# Patient Record
Sex: Male | Born: 1950 | Race: White | Hispanic: No | Marital: Married | Smoking: Former smoker
Health system: Southern US, Community
[De-identification: ages and names within clinical notes are randomized; demographics above are authoritative.]

## PROBLEM LIST (undated history)

## (undated) DIAGNOSIS — I10 Essential (primary) hypertension: Secondary | ICD-10-CM

## (undated) DIAGNOSIS — E7800 Pure hypercholesterolemia, unspecified: Secondary | ICD-10-CM

## (undated) DIAGNOSIS — G4730 Sleep apnea, unspecified: Secondary | ICD-10-CM

## (undated) DIAGNOSIS — G4733 Obstructive sleep apnea (adult) (pediatric): Secondary | ICD-10-CM

## (undated) DIAGNOSIS — I029 Rheumatic chorea without heart involvement: Secondary | ICD-10-CM

## (undated) DIAGNOSIS — T7840XA Allergy, unspecified, initial encounter: Secondary | ICD-10-CM

## (undated) DIAGNOSIS — I208 Other forms of angina pectoris: Secondary | ICD-10-CM

## (undated) DIAGNOSIS — M1990 Unspecified osteoarthritis, unspecified site: Secondary | ICD-10-CM

## (undated) DIAGNOSIS — I2510 Atherosclerotic heart disease of native coronary artery without angina pectoris: Secondary | ICD-10-CM

## (undated) DIAGNOSIS — H532 Diplopia: Secondary | ICD-10-CM

## (undated) DIAGNOSIS — R7303 Prediabetes: Secondary | ICD-10-CM

## (undated) DIAGNOSIS — I2089 Other forms of angina pectoris: Secondary | ICD-10-CM

## (undated) DIAGNOSIS — Z8679 Personal history of other diseases of the circulatory system: Secondary | ICD-10-CM

## (undated) DIAGNOSIS — K219 Gastro-esophageal reflux disease without esophagitis: Secondary | ICD-10-CM

## (undated) DIAGNOSIS — R011 Cardiac murmur, unspecified: Secondary | ICD-10-CM

## (undated) HISTORY — DX: Obstructive sleep apnea (adult) (pediatric): G47.33

## (undated) HISTORY — DX: Atherosclerotic heart disease of native coronary artery without angina pectoris: I25.10

## (undated) HISTORY — DX: Rheumatic chorea without heart involvement: I02.9

## (undated) HISTORY — DX: Personal history of other diseases of the circulatory system: Z86.79

## (undated) HISTORY — PX: OTHER SURGICAL HISTORY: SHX169

## (undated) HISTORY — DX: Other forms of angina pectoris: I20.89

## (undated) HISTORY — DX: Pure hypercholesterolemia, unspecified: E78.00

## (undated) HISTORY — PX: NO PAST SURGERIES: SHX2092

## (undated) HISTORY — DX: Prediabetes: R73.03

## (undated) HISTORY — DX: Diplopia: H53.2

## (undated) HISTORY — DX: Allergy, unspecified, initial encounter: T78.40XA

## (undated) HISTORY — DX: Sleep apnea, unspecified: G47.30

## (undated) HISTORY — DX: Other forms of angina pectoris: I20.8

---

## 2004-09-01 HISTORY — PX: COLONOSCOPY: SHX174

## 2016-01-02 ENCOUNTER — Encounter: Payer: Self-pay | Admitting: Neurology

## 2016-01-02 ENCOUNTER — Ambulatory Visit (INDEPENDENT_AMBULATORY_CARE_PROVIDER_SITE_OTHER): Payer: BLUE CROSS/BLUE SHIELD | Admitting: Neurology

## 2016-01-02 VITALS — BP 148/74 | HR 72 | Ht 72.0 in | Wt 245.8 lb

## 2016-01-02 DIAGNOSIS — H532 Diplopia: Secondary | ICD-10-CM

## 2016-01-02 HISTORY — DX: Diplopia: H53.2

## 2016-01-02 NOTE — Progress Notes (Signed)
Reason for visit: Double vision  Referring physician: Dr. Gregary Barajas is a 65 y.o. male  History of present illness:  Harold Barajas is a 65 year old right-handed white male with a history of onset of double vision that occurred upon awakening on 12/14/2015. The patient had a painless onset of the double vision, but he also noted significant facial swelling at the time that the double vision started. The patient has had some issues with some sinus symptoms for several weeks prior to the onset of the double vision. The patient denied any headache, numbness or weakness on the face, arms, or legs. He denied any trouble with dizziness or vertigo. He has tinnitus, no ear pain. He denies any speech or swallowing changes. He has not had any confusion or neck stiffness. He reports that the double vision is vertical with some horizontal component at times. The double vision is worse when he is looking down to the left. When he tilts his head to the left, the double vision improves, but if he tilts his head to the right the double vision worsens. The patient claims that he has a history of borderline diabetes. He has not had a head scan or any blood work done since the onset of the double vision. He is sent to this office for an evaluation. The patient reports no ptosis on either side since the onset of the double vision.  Past Medical History  Diagnosis Date  . High cholesterol   . Angina at rest [facility])   . Diplopia 01/02/2016  . OSA treated with BiPAP   . 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 History of rheumatic fever   . Borderline diabetes     Past Surgical History  Procedure Laterality Date  . None      Family History  Problem Relation Age of Onset  . Stroke Mother   . Heart attack Mother   . Aneurysm Mother     cerebral  . Aneurysm Father   . Diabetes Brother   . Heart attack Brother   . Leukemia Maternal Grandmother     Social history:  reports that he has quit smoking. He  does not have any smokeless tobacco history on file. He reports that he drinks alcohol. He reports that he does not use illicit drugs.  Medications:  Prior to Admission medications   Medication Sig Start Date End Date Taking? Authorizing Provider  aspirin 81 MG tablet Take 81 mg by mouth daily.   Yes Historical Provider, MD  cetirizine (ZYRTEC) 10 MG tablet Take 10 mg by mouth daily. 12/13/15  Yes Historical Provider, MD  meclizine (ANTIVERT) 12.5 MG tablet 1 TABLET AS NEEDED ONCE A DAY AT BEDTIME ORALLY 30 DAY(S) 12/21/15  Yes Historical Provider, MD  metoprolol (LOPRESSOR) 50 MG tablet  12/13/15  Yes Historical Provider, MD  Omega-3 Fatty Acids (FISH OIL PO) Take 1,400 mg by mouth daily.   Yes Historical Provider, MD  simvastatin (ZOCOR) 40 MG tablet Reported on 01/02/2016 10/28/15   Historical Provider, MD      Allergies  Allergen Reactions  . Penicillins     ROS:  Out of a complete 14 system review of symptoms, the patient complains only of the following symptoms, and all other reviewed systems are negative.  Ringing in the ears Blurred vision, double vision Joint pain Runny nose Dizziness Anxiety Memory loss Sleep apnea on CPAP  Blood pressure 148/74, pulse 72, height 6' (1.829 m),  weight 245 lb 12 oz (111.471 kg).  Physical Exam  General: The patient is alert and cooperative at the time of the examination. The patient is moderately obese.  Eyes: Pupils are equal, round, and reactive to light. Discs are flat bilaterally. Good venous pulsations are seen.  Neck: The neck is supple, no carotid bruits are noted.  Respiratory: The respiratory examination is clear.  Cardiovascular: The cardiovascular examination reveals a regular rate and rhythm, no obvious murmurs or rubs are noted.  Skin: Extremities are without significant edema.  Neurologic Exam  Mental status: The patient is alert and oriented x 3 at the time of the examination. The patient has apparent normal recent  and remote memory, with an apparently normal attention span and concentration ability.  Cranial nerves: Facial symmetry is present. There is good sensation of the face to pinprick and soft touch bilaterally. The strength of the facial muscles and the muscles to head turning and shoulder shrug are normal bilaterally. Speech is well enunciated, no aphasia or dysarthria is noted. Extraocular movements are full. Visual fields are full. Cover test is positive when the patient is looking down into the left. The left eye goes down and the right eye goes up. The tongue is midline, and the patient has symmetric elevation of the soft palate. No obvious hearing deficits are noted.  Motor: The motor testing reveals 5 over 5 strength of all 4 extremities. Good symmetric motor tone is noted throughout.  Sensory: Sensory testing is intact to pinprick, soft touch, vibration sensation, and position sense on all 4 extremities. No evidence of extinction is noted.  Coordination: Cerebellar testing reveals good finger-nose-finger and heel-to-shin bilaterally.  Gait and station: Gait is normal. Tandem gait is normal. Romberg is negative. No drift is seen.  Reflexes: Deep tendon reflexes are symmetric and normal bilaterally. Toes are downgoing bilaterally.   Assessment/Plan:  1. Double vision  2. History of borderline diabetes  The patient appears clinically to have a right fourth cranial nerve palsy. The patient will be sent for an evaluation to include MRI of the brain, and MRA of the head given the family history of cerebral aneurysm. The patient will be sent for blood work today. He will follow-up in 4 months. Hopefully, the double vision will spontaneously improve within the next several months.  Jill Alexanders MD 01/02/2016 8:03 PM  [facility] [address] [place], [place] [address]  Phone [phone] Fax [phone]

## 2016-01-02 NOTE — Patient Instructions (Signed)
Diplopia °Diplopia is the condition of having double vision or seeing two of a single object. There are many causes of diplopia. Some are not dangerous and can be easily corrected. Diplopia may also be a symptom of a serious medical problem. °There are two types of diplopia. °· Monocular diplopia. This is double vision that affects only one eye. Monocular diplopia is often caused by a clouding of the lens in your eye (cataract) or by disruptions in the way that your eye focuses light. °· Binocular diplopia. This is double vision that affects both eyes. However, when you shut one eye, the double vision will go away. Binocular diplopia may be more serious. It can be caused by: °¨ Problems with the nerves or muscles that are responsible for eye movement. °¨ Neurologic diseases. °¨ Thyroid problems. °¨ Tumors. °¨ An infection near your eyes. °¨ A stroke. °You may need to see a health care provider who specializes in eye conditions (ophthalmologist) or a nerve specialist (neurologist) to find the cause. °HOME CARE INSTRUCTIONS °· Tell your health care provider about any changes in your vision. °· Do not drive or operate heavy machinery if diplopia interferes with your vision. °· Keep all follow-up visits as directed by your health care provider. This is important. °SEEK MEDICAL CARE IF: °· Your diplopia gets worse. °· You develop any other symptoms along with your diplopia, such as: °¨ Weakness. °¨ Numbness. °¨ Headache. °¨ Eye pain. °¨ Clumsiness. °¨ Nausea. °¨ Drooping eyelids. °¨ Abnormal movement of one of your eyes. °SEEK IMMEDIATE MEDICAL CARE IF: °· You have sudden vision loss. °· You suddenly get a very bad headache. °· You have sudden weakness or numbness. °· You suddenly lose the ability to speak, understand speech, or both. °  °This information is not intended to replace advice given to you by your health care provider. Make sure you discuss any questions you have with your health care provider. °  °Document  Released: 06/19/2004 Document Revised: 01/02/2015 Document Reviewed: 07/12/2014 °Elsevier Interactive Patient Education ©2016 [employer] ° °

## 2016-01-04 ENCOUNTER — Telehealth: Payer: Self-pay | Admitting: Neurology

## 2016-01-04 LAB — COMPREHENSIVE METABOLIC PANEL
ALBUMIN: 4.2 g/dL (ref 3.6–4.8)
ALK PHOS: 126 IU/L — AB (ref 39–117)
ALT: 23 IU/L (ref 0–44)
AST: 16 IU/L (ref 0–40)
Albumin/Globulin Ratio: 1.6 (ref 1.2–2.2)
BUN/Creatinine Ratio: 13 (ref 10–24)
BUN: 15 mg/dL (ref 8–27)
Bilirubin Total: 0.3 mg/dL (ref 0.0–1.2)
CALCIUM: 9.2 mg/dL (ref 8.6–10.2)
CO2: 27 mmol/L (ref 18–29)
CREATININE: 1.13 mg/dL (ref 0.76–1.27)
Chloride: 100 mmol/L (ref 96–106)
GFR calc Af Amer: 79 mL/min/{1.73_m2} (ref >59)
GFR calc non Af Amer: 68 mL/min/{1.73_m2} (ref >59)
GLUCOSE: 153 mg/dL — AB (ref 65–99)
Globulin, Total: 2.6 g/dL (ref 1.5–4.5)
Potassium: 5.3 mmol/L — ABNORMAL HIGH (ref 3.5–5.2)
Sodium: 141 mmol/L (ref 134–144)
Total Protein: 6.8 g/dL (ref 6.0–8.5)

## 2016-01-04 LAB — VITAMIN B12: VITAMIN B 12: 244 pg/mL (ref 211–946)

## 2016-01-04 LAB — HEMOGLOBIN A1C
Est. average glucose Bld gHb Est-mCnc: 134 mg/dL
HEMOGLOBIN A1C: 6.3 % — AB (ref 4.8–5.6)

## 2016-01-04 LAB — SEDIMENTATION RATE: Sed Rate: 5 mm/hr (ref 0–30)

## 2016-01-04 LAB — ACETYLCHOLINE RECEPTOR, BINDING

## 2016-01-04 LAB — B. BURGDORFI ANTIBODIES

## 2016-01-04 LAB — ANGIOTENSIN CONVERTING ENZYME: Angio Convert Enzyme: 26 U/L (ref 14–82)

## 2016-01-04 LAB — ANA W/REFLEX: Anti Nuclear Antibody(ANA): NEGATIVE

## 2016-01-04 NOTE — Telephone Encounter (Signed)
I called the patient. The blood work showed a slightly elevated hemoglobin A1c, the patient has prediabetes. Potassium was minimally elevated as was the alk phosphatase.

## 2016-01-09 NOTE — Telephone Encounter (Signed)
Returned pt TC. Reviewed A1C results (6.3) w/ pt. He is aware that A1C between 5.7 and 6.4 is indicative of pre-diabetes. Verbalized understanding and appreciation for call.

## 2016-01-09 NOTE — Telephone Encounter (Signed)
Patient called to request A1c results.

## 2016-01-16 ENCOUNTER — Ambulatory Visit (INDEPENDENT_AMBULATORY_CARE_PROVIDER_SITE_OTHER): Payer: BLUE CROSS/BLUE SHIELD

## 2016-01-16 DIAGNOSIS — H532 Diplopia: Secondary | ICD-10-CM | POA: Diagnosis not present

## 2016-01-17 MED ORDER — GADOPENTETATE DIMEGLUMINE 469.01 MG/ML IV SOLN: 20.0000 mL | Freq: Once | INTRAVENOUS | Status: AC | PRN

## 2016-01-18 ENCOUNTER — Telehealth: Payer: Self-pay | Admitting: Neurology

## 2016-01-18 NOTE — Telephone Encounter (Signed)
  I called patient. MRI the brain is essentially normal, minimal atrophy seen. The MRA does not show any aneurysm, no significant stenosis. The patient indicates that the double vision is already improving, this suggests a good prognosis for full recovery.   MRI brain 01/17/16:  IMPRESSION: This MRI of the brain with and without contrast shows the following: 1. Brain parenchyma appears normal before and after contrast. 2. Minimal age-appropriate cortical atrophy. 3. No acute findings.   MRA head 01/17/16:  IMPRESSION: This MR angiogram of the intracranial vessels shows the following: 1. 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 This does not appear to be hemodynamically significant. 2. No aneurysms are noted.

## 2016-05-09 ENCOUNTER — Encounter: Payer: Self-pay | Admitting: Neurology

## 2016-05-09 ENCOUNTER — Ambulatory Visit (INDEPENDENT_AMBULATORY_CARE_PROVIDER_SITE_OTHER): Payer: BLUE CROSS/BLUE SHIELD | Admitting: Neurology

## 2016-05-09 VITALS — BP 142/76 | HR 62 | Ht 72.0 in | Wt 248.5 lb

## 2016-05-09 DIAGNOSIS — H4911 Fourth [trochlear] nerve palsy, right eye: Secondary | ICD-10-CM | POA: Diagnosis not present

## 2016-05-09 DIAGNOSIS — H4910 Fourth [trochlear] nerve palsy, unspecified eye: Secondary | ICD-10-CM | POA: Insufficient documentation

## 2016-05-09 DIAGNOSIS — H532 Diplopia: Secondary | ICD-10-CM | POA: Diagnosis not present

## 2016-05-09 NOTE — Progress Notes (Signed)
Reason for visit: Double vision  Harold Barajas is an 65 y.o. male  History of present illness:  Harold Barajas is a 65 year old right-handed white male with a history of prediabetes. The patient had onset of a right fourth cranial nerve palsy in April 2017. The patient has done quite well, the double vision has completely resolved over a couple months after onset. MRI the brain and MRA of the head were unremarkable, no evidence of an aneurysm was seen. The patient returns for an evaluation. He has not had any further problems with double vision, numbness or weakness of extremities, or difficulty with balance.  Past Medical History:  Diagnosis Date  . Angina at rest [facility])   . Borderline diabetes   . Diplopia 01/02/2016  . High cholesterol   . History of rheumatic fever   . OSA treated with BiPAP   . 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 None      Family History  Problem Relation Age of Onset  . Stroke Mother   . Heart attack Mother   . Aneurysm Mother     cerebral  . Aneurysm Father   . Diabetes Brother   . Heart attack Brother   . Leukemia Maternal Grandmother     Social history:  reports that he has quit smoking. He has never used smokeless tobacco. He reports that he drinks alcohol. He reports that he does not use drugs.    Allergies  Allergen Reactions  . Penicillins     Medications:  Prior to Admission medications   Medication Sig Start Date End Date Taking? Authorizing Provider  aspirin 81 MG tablet Take 81 mg by mouth daily.   Yes Historical Provider, MD  cetirizine (ZYRTEC) 10 MG tablet Take 10 mg by mouth daily. 12/13/15  Yes Historical Provider, MD  diclofenac (VOLTAREN) 75 MG EC tablet  04/29/16  Yes Historical Provider, MD  meclizine (ANTIVERT) 12.5 MG tablet 1 TABLET AS NEEDED ONCE A DAY AT BEDTIME ORALLY 30 DAY(S) 12/21/15  Yes Historical Provider, MD  metoprolol (LOPRESSOR) 50 MG tablet  12/13/15  Yes  Historical Provider, MD  Omega-3 Fatty Acids (FISH OIL PO) Take 1,400 mg by mouth daily.   Yes Historical Provider, MD  simvastatin (ZOCOR) 40 MG tablet Reported on 01/02/2016 10/28/15  Yes Historical Provider, MD    ROS:  Out of a complete 14 system review of symptoms, the patient complains only of the following symptoms, and all other reviewed systems are negative.  Sleep apnea Joint pain, joint swelling, back pain  Blood pressure (!) 142/76, pulse 62, height 6' (1.829 m), weight 248 lb 8 oz (112.7 kg).  Physical Exam  General: The patient is alert and cooperative at the time of the examination. The patient is moderately obese.  Skin: No significant peripheral edema is noted.   Neurologic Exam  Mental status: The patient is alert and oriented x 3 at the time of the examination. The patient has apparent normal recent and remote memory, with an apparently normal attention span and concentration ability.   Cranial nerves: Facial symmetry is present. Speech is normal, no aphasia or dysarthria is noted. Extraocular movements are full. Visual fields are full.  Motor: The patient has good strength in all 4 extremities.  Sensory examination: Soft touch sensation is symmetric on the face, arms, and legs.  Coordination: The patient has good finger-nose-finger and heel-to-shin bilaterally.  Gait and  station: The patient has a normal gait. Tandem gait is normal. Romberg is negative. No drift is seen.  Reflexes: Deep tendon reflexes are symmetric.   MRI brain 01/18/16:  IMPRESSION:  This MRI of the brain with and without contrast shows the following: 1.   Brain parenchyma appears normal before and after contrast. 2.   Minimal age-appropriate cortical atrophy. 3.   No acute findings.   MRA head 01/18/16:   IMPRESSION:  This MR angiogram of the intracranial vessels shows the following: 1.    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 This does not appear  to be hemodynamically significant. 2.    No aneurysms are noted.    Assessment/Plan:  1. Right fourth nerve palsy, resolved  The patient has had full resolution of his double vision. This likely was related to the history of prediabetes. I have encouraged him to enter an exercise program, and a low carbohydrate diet to help improve the insulin resistance. The patient will follow-up through this office on an as-needed basis.  Jill Alexanders MD 05/09/2016 12:37 PM  [facility] [address] [place],  [address]  Phone [phone] Fax [phone]

## 2016-12-30 ENCOUNTER — Other Ambulatory Visit: Payer: Self-pay | Admitting: Neurological Surgery

## 2017-01-06 NOTE — Pre-Procedure Instructions (Addendum)
Gwynneth Macleod.  01/06/2017      [id number] [place], [place] - [address] [place] [address] Phone: [phone] Fax: [phone]    Your procedure is scheduled on 01/14/17  Report to [facility] Admitting at 1130 A.M.  Call this number if you have problems the morning of surgery:  [phone]   Remember:  Do not eat food or drink liquids after midnight.  Take these medicines the morning of surgery with A SIP OF WATER      Metoprolol, omeprazole(protonix)  STOP all herbel meds, nsaids (aleve,naproxen,advil,ibuprofen) prior to surgery starting TODAY 01/07/17 including all vitamins/supplements, aspirin, meloxicam   Do not wear jewelry, make-up or nail polish.  Do not wear lotions, powders, or perfumes, or deoderant.  Do not shave 48 hours prior to surgery.  Men may shave face and neck.  Do not bring valuables to the hospital.  [facility] is not responsible for any belongings or valuables.  Contacts, dentures or bridgework may not be worn into surgery.  Leave your suitcase in the car.  After surgery it may be brought to your room.  For patients admitted to the hospital, discharge time will be determined by your treatment team.  Patients discharged the day of surgery will not be allowed to drive home.   Special instructions:   Special Instructions: [place] - Preparing for Surgery  Before surgery, you can play an important role.  Because skin is not sterile, your skin needs to be as free of germs as possible.  You can reduce the number of germs on you skin by washing with CHG (chlorahexidine gluconate) soap before surgery.  CHG is an antiseptic cleaner which kills germs and bonds with the skin to continue killing germs even after washing.  Please DO NOT use if you have an allergy to CHG or antibacterial soaps.  If your skin becomes reddened/irritated stop using the CHG and inform your nurse when you arrive  at Short Stay.  Do not shave (including legs and underarms) for at least 48 hours prior to the first CHG shower.  You may shave your face.  Please follow these instructions carefully:   1.  Shower with CHG Soap the night before surgery and the morning of Surgery.  2.  If you choose to wash your hair, wash your hair first as usual with your normal shampoo.  3.  After you shampoo, rinse your hair and body thoroughly to remove the Shampoo.  4.  Use CHG as you would any other liquid soap.  You can apply chg directly  to the skin and wash gently with scrungie or a clean washcloth.  5.  Apply the CHG Soap to your body ONLY FROM THE NECK DOWN.  Do not use on open wounds or open sores.  Avoid contact with your eyes ears, mouth and genitals (private parts).  Wash genitals (private parts)       with your normal soap.  6.  Wash thoroughly, paying special attention to the area where your surgery will be performed.  7.  Thoroughly rinse your body with warm water from the neck down.  8.  DO NOT shower/wash with your normal soap after using and rinsing off the CHG Soap.  9.  Pat yourself dry with a clean towel.            10.  Wear clean pajamas.  11.  Place clean sheets on your bed the night of your first shower and do not sleep with pets.  Day of Surgery  Do not apply any lotions/deodorants the morning of surgery.  Please wear clean clothes to the hospital/surgery center.  Please read over the  fact sheets that you were given.

## 2017-01-07 ENCOUNTER — Encounter (HOSPITAL_COMMUNITY): Payer: Self-pay

## 2017-01-07 ENCOUNTER — Encounter (HOSPITAL_COMMUNITY)
Admission: RE | Admit: 2017-01-07 | Discharge: 2017-01-07 | Disposition: A | Discharge status: Home / Self Care | Payer: Medicare Other | Source: Ambulatory Visit | Attending: Neurological Surgery | Admitting: Neurological Surgery

## 2017-01-07 DIAGNOSIS — Z87891 Personal history of nicotine dependence: Secondary | ICD-10-CM | POA: Insufficient documentation

## 2017-01-07 DIAGNOSIS — G4733 Obstructive sleep apnea (adult) (pediatric): Secondary | ICD-10-CM | POA: Insufficient documentation

## 2017-01-07 DIAGNOSIS — E785 Hyperlipidemia, unspecified: Secondary | ICD-10-CM | POA: Insufficient documentation

## 2017-01-07 DIAGNOSIS — K219 Gastro-esophageal reflux disease without esophagitis: Secondary | ICD-10-CM | POA: Insufficient documentation

## 2017-01-07 DIAGNOSIS — Z01812 Encounter for preprocedural laboratory examination: Secondary | ICD-10-CM | POA: Diagnosis present

## 2017-01-07 HISTORY — DX: Cardiac murmur, unspecified: R01.1

## 2017-01-07 HISTORY — DX: Prediabetes: R73.03

## 2017-01-07 HISTORY — DX: Unspecified osteoarthritis, unspecified site: M19.90

## 2017-01-07 HISTORY — DX: Gastro-esophageal reflux disease without esophagitis: K21.9

## 2017-01-07 HISTORY — DX: Essential (primary) hypertension: I10

## 2017-01-07 LAB — BASIC METABOLIC PANEL
ANION GAP: 8 (ref 5–15)
BUN: 15 mg/dL (ref 6–20)
CALCIUM: 8.7 mg/dL — AB (ref 8.9–10.3)
CO2: 25 mmol/L (ref 22–32)
Chloride: 103 mmol/L (ref 101–111)
Creatinine, Ser: 1.25 mg/dL — ABNORMAL HIGH (ref 0.61–1.24)
GFR calc Af Amer: >60 mL/min (ref >60)
GFR, EST NON AFRICAN AMERICAN: 59 mL/min — AB (ref >60)
GLUCOSE: 185 mg/dL — AB (ref 65–99)
Potassium: 4.3 mmol/L (ref 3.5–5.1)
Sodium: 136 mmol/L (ref 135–145)

## 2017-01-07 LAB — CBC
HCT: 44.2 % (ref 39.0–52.0)
HEMOGLOBIN: 15.6 g/dL (ref 13.0–17.0)
MCH: 31.8 pg (ref 26.0–34.0)
MCHC: 35.3 g/dL (ref 30.0–36.0)
MCV: 90 fL (ref 78.0–100.0)
Platelets: 195 10*3/uL (ref 150–400)
RBC: 4.91 MIL/uL (ref 4.22–5.81)
RDW: 12.9 % (ref 11.5–15.5)
WBC: 6.8 10*3/uL (ref 4.0–10.5)

## 2017-01-07 LAB — SURGICAL PCR SCREEN
MRSA, PCR: NEGATIVE
STAPHYLOCOCCUS AUREUS: NEGATIVE

## 2017-01-07 LAB — GLUCOSE, CAPILLARY: Glucose-Capillary: 207 mg/dL — ABNORMAL HIGH (ref 65–99)

## 2017-01-08 LAB — HEMOGLOBIN A1C
HEMOGLOBIN A1C: 6.9 % — AB (ref 4.8–5.6)
MEAN PLASMA GLUCOSE: 151 mg/dL

## 2017-01-09 NOTE — Progress Notes (Signed)
Anesthesia Chart Review:  Pt is a 66 year old male scheduled for L2-S1 laminectomy for decompression on 01/14/2017 with Cyndy Freeze, M.D.  - PCP is Eulogio Ditch, NP - Cardiologist is Delanna Notice, MD who cleared pt for surgery.   PMH includes: HTN, pre-diabetes, hyperlipidemia, OSA, heart murmur (as a child), history of rheumatic fever (as a child), GERD. Former smoker. BMI 35.  Medications include: ASA 81 mg, lisinopril, metoprolol, Prilosec, simvastatin  Preoperative labs reviewed. HbA1c 6.9, glucose 185.  EKG 10/02/16: Sinus rhythm.  By notes, patient had exercise echo 11/10/06 that showed no ischemia, EF 60%.  By notes, pt had cardiac cath 11/27/95 that showed:  - LM: normal - LAD: Mid LAD tapers to 50% with diffuse disease. - Ramus intermedius: Not present - LCx: Normal - RCA: Normal and small, nondominant  If no changes, I anticipate pt can proceed with surgery as scheduled.   Willeen Cass, FNP-BC [facility] Short Stay Surgical [facility]/Anesthesiology Phone: [phone] 01/09/2017 2:48 PM

## 2017-01-19 MED ORDER — VANCOMYCIN HCL 10 G IV SOLR
1500.0000 mg | INTRAVENOUS | Status: AC
  Administered 2017-01-20: 1500 mg via INTRAVENOUS
  Filled 2017-01-19: qty 1500

## 2017-01-19 NOTE — Progress Notes (Signed)
Pt instructed to arrive at 12:30; Janett Billow, Surgical Coordinator, confirmed arrival time is 12:30 for 2:30 procedure.

## 2017-01-20 ENCOUNTER — Inpatient Hospital Stay (HOSPITAL_COMMUNITY): Payer: Medicare Other

## 2017-01-20 ENCOUNTER — Inpatient Hospital Stay (HOSPITAL_COMMUNITY)
Admission: RE | Admit: 2017-01-20 | Discharge: 2017-01-21 | DRG: 520 | Disposition: A | Discharge status: Home / Self Care | Payer: Medicare Other | Source: Ambulatory Visit | Attending: Neurological Surgery | Admitting: Neurological Surgery

## 2017-01-20 ENCOUNTER — Encounter (HOSPITAL_COMMUNITY): Payer: Self-pay | Admitting: Anesthesiology

## 2017-01-20 ENCOUNTER — Inpatient Hospital Stay (HOSPITAL_COMMUNITY): Payer: Medicare Other | Admitting: Anesthesiology

## 2017-01-20 ENCOUNTER — Inpatient Hospital Stay (HOSPITAL_COMMUNITY): Payer: Medicare Other | Admitting: Vascular Surgery

## 2017-01-20 ENCOUNTER — Encounter (HOSPITAL_COMMUNITY)
Admission: RE | Disposition: A | Discharge status: Home / Self Care | Payer: Self-pay | Source: Ambulatory Visit | Attending: Neurological Surgery

## 2017-01-20 DIAGNOSIS — K219 Gastro-esophageal reflux disease without esophagitis: Secondary | ICD-10-CM | POA: Diagnosis present

## 2017-01-20 DIAGNOSIS — Z8619 Personal history of other infectious and parasitic diseases: Secondary | ICD-10-CM

## 2017-01-20 DIAGNOSIS — M48061 Spinal stenosis, lumbar region without neurogenic claudication: Secondary | ICD-10-CM | POA: Diagnosis present

## 2017-01-20 DIAGNOSIS — Z791 Long term (current) use of non-steroidal anti-inflammatories (NSAID): Secondary | ICD-10-CM

## 2017-01-20 DIAGNOSIS — I10 Essential (primary) hypertension: Secondary | ICD-10-CM | POA: Diagnosis present

## 2017-01-20 DIAGNOSIS — Z9989 Dependence on other enabling machines and devices: Secondary | ICD-10-CM | POA: Diagnosis not present

## 2017-01-20 DIAGNOSIS — Z880 Allergy status to penicillin: Secondary | ICD-10-CM

## 2017-01-20 DIAGNOSIS — R7303 Prediabetes: Secondary | ICD-10-CM | POA: Diagnosis present

## 2017-01-20 DIAGNOSIS — M1990 Unspecified osteoarthritis, unspecified site: Secondary | ICD-10-CM | POA: Diagnosis present

## 2017-01-20 DIAGNOSIS — E7800 Pure hypercholesterolemia, unspecified: Secondary | ICD-10-CM | POA: Diagnosis present

## 2017-01-20 DIAGNOSIS — Z419 Encounter for procedure for purposes other than remedying health state, unspecified: Secondary | ICD-10-CM

## 2017-01-20 DIAGNOSIS — Z79899 Other long term (current) drug therapy: Secondary | ICD-10-CM

## 2017-01-20 DIAGNOSIS — G4733 Obstructive sleep apnea (adult) (pediatric): Secondary | ICD-10-CM | POA: Diagnosis present

## 2017-01-20 DIAGNOSIS — Z7982 Long term (current) use of aspirin: Secondary | ICD-10-CM

## 2017-01-20 DIAGNOSIS — M5416 Radiculopathy, lumbar region: Secondary | ICD-10-CM | POA: Diagnosis present

## 2017-01-20 DIAGNOSIS — M4727 Other spondylosis with radiculopathy, lumbosacral region: Principal | ICD-10-CM | POA: Diagnosis present

## 2017-01-20 DIAGNOSIS — Z87891 Personal history of nicotine dependence: Secondary | ICD-10-CM

## 2017-01-20 HISTORY — PX: LUMBAR LAMINECTOMY/DECOMPRESSION MICRODISCECTOMY: SHX5026

## 2017-01-20 LAB — GLUCOSE, CAPILLARY
GLUCOSE-CAPILLARY: 100 mg/dL — AB (ref 65–99)
GLUCOSE-CAPILLARY: 162 mg/dL — AB (ref 65–99)
Glucose-Capillary: 110 mg/dL — ABNORMAL HIGH (ref 65–99)
Glucose-Capillary: 101 mg/dL — ABNORMAL HIGH (ref 65–99)

## 2017-01-20 SURGERY — LUMBAR LAMINECTOMY/DECOMPRESSION MICRODISCECTOMY 4 LEVEL
Anesthesia: General | Site: Back

## 2017-01-20 MED ORDER — HYDROMORPHONE HCL 1 MG/ML IJ SOLN: 1.0000 mg | INTRAMUSCULAR | Status: DC | PRN

## 2017-01-20 MED ORDER — PROPOFOL 10 MG/ML IV BOLUS
INTRAVENOUS | Status: DC | PRN
  Administered 2017-01-20: 200 mg via INTRAVENOUS

## 2017-01-20 MED ORDER — PROPOFOL 10 MG/ML IV BOLUS
INTRAVENOUS | Status: AC
  Filled 2017-01-20: qty 20

## 2017-01-20 MED ORDER — PROMETHAZINE HCL 25 MG/ML IJ SOLN: 6.2500 mg | INTRAMUSCULAR | Status: DC | PRN

## 2017-01-20 MED ORDER — ROCURONIUM BROMIDE 10 MG/ML (PF) SYRINGE
PREFILLED_SYRINGE | INTRAVENOUS | Status: AC
  Filled 2017-01-20: qty 10

## 2017-01-20 MED ORDER — BUPIVACAINE HCL 0.5 % IJ SOLN
INTRAMUSCULAR | Status: DC | PRN
  Administered 2017-01-20: 15 mL

## 2017-01-20 MED ORDER — FENTANYL CITRATE (PF) 100 MCG/2ML IJ SOLN
INTRAMUSCULAR | Status: DC | PRN
  Administered 2017-01-20 (×2): 50 ug via INTRAVENOUS
  Administered 2017-01-20: 150 ug via INTRAVENOUS

## 2017-01-20 MED ORDER — ALBUMIN HUMAN 5 % IV SOLN
INTRAVENOUS | Status: DC | PRN
  Administered 2017-01-20: 19:00:00 via INTRAVENOUS

## 2017-01-20 MED ORDER — HYDROMORPHONE HCL 1 MG/ML IJ SOLN
INTRAMUSCULAR | Status: AC
  Filled 2017-01-20: qty 0.5

## 2017-01-20 MED ORDER — MIDAZOLAM HCL 2 MG/2ML IJ SOLN
INTRAMUSCULAR | Status: AC
  Filled 2017-01-20: qty 2

## 2017-01-20 MED ORDER — FLEET ENEMA 7-19 GM/118ML RE ENEM: 1.0000 | ENEMA | Freq: Once | RECTAL | Status: DC | PRN

## 2017-01-20 MED ORDER — BUPIVACAINE LIPOSOME 1.3 % IJ SUSP
INTRAMUSCULAR | Status: DC | PRN
  Administered 2017-01-20: 20 mL

## 2017-01-20 MED ORDER — GABAPENTIN 300 MG PO CAPS
300.0000 mg | ORAL_CAPSULE | Freq: Three times a day (TID) | ORAL | Status: DC
  Administered 2017-01-20 – 2017-01-21 (×2): 300 mg via ORAL
  Filled 2017-01-20 (×2): qty 1

## 2017-01-20 MED ORDER — GELATIN ABSORBABLE MT POWD
OROMUCOSAL | Status: DC | PRN
  Administered 2017-01-20 (×2): via TOPICAL

## 2017-01-20 MED ORDER — DEXAMETHASONE SODIUM PHOSPHATE 10 MG/ML IJ SOLN
INTRAMUSCULAR | Status: DC | PRN
  Administered 2017-01-20: 10 mg via INTRAVENOUS

## 2017-01-20 MED ORDER — LIDOCAINE 2% (20 MG/ML) 5 ML SYRINGE
INTRAMUSCULAR | Status: AC
  Filled 2017-01-20: qty 5

## 2017-01-20 MED ORDER — ONDANSETRON HCL 4 MG PO TABS: 4.0000 mg | ORAL_TABLET | Freq: Four times a day (QID) | ORAL | Status: DC | PRN

## 2017-01-20 MED ORDER — MENTHOL 3 MG MT LOZG: 1.0000 | LOZENGE | OROMUCOSAL | Status: DC | PRN

## 2017-01-20 MED ORDER — ONDANSETRON HCL 4 MG/2ML IJ SOLN
INTRAMUSCULAR | Status: AC
  Filled 2017-01-20: qty 2

## 2017-01-20 MED ORDER — ALUM & MAG HYDROXIDE-SIMETH 200-200-20 MG/5ML PO SUSP: 30.0000 mL | Freq: Four times a day (QID) | ORAL | Status: DC | PRN

## 2017-01-20 MED ORDER — KETOROLAC TROMETHAMINE 30 MG/ML IJ SOLN
INTRAMUSCULAR | Status: AC
  Filled 2017-01-20: qty 1

## 2017-01-20 MED ORDER — ACETAMINOPHEN 10 MG/ML IV SOLN
INTRAVENOUS | Status: AC
  Filled 2017-01-20: qty 100

## 2017-01-20 MED ORDER — BUPIVACAINE LIPOSOME 1.3 % IJ SUSP
20.0000 mL | INTRAMUSCULAR | Status: DC
  Filled 2017-01-20: qty 20

## 2017-01-20 MED ORDER — VANCOMYCIN HCL 1000 MG IV SOLR
INTRAVENOUS | Status: DC | PRN
  Administered 2017-01-20: 1000 mg via TOPICAL

## 2017-01-20 MED ORDER — LIDOCAINE HCL (CARDIAC) 20 MG/ML IV SOLN
INTRAVENOUS | Status: DC | PRN
  Administered 2017-01-20: 60 mg via INTRAVENOUS

## 2017-01-20 MED ORDER — CHLORHEXIDINE GLUCONATE CLOTH 2 % EX PADS: 6.0000 | MEDICATED_PAD | Freq: Once | CUTANEOUS | Status: DC

## 2017-01-20 MED ORDER — FENTANYL CITRATE (PF) 250 MCG/5ML IJ SOLN
INTRAMUSCULAR | Status: AC
  Filled 2017-01-20: qty 5

## 2017-01-20 MED ORDER — VANCOMYCIN HCL IN DEXTROSE 750-5 MG/150ML-% IV SOLN
750.0000 mg | Freq: Two times a day (BID) | INTRAVENOUS | Status: DC
  Administered 2017-01-21: 750 mg via INTRAVENOUS
  Filled 2017-01-20 (×2): qty 150

## 2017-01-20 MED ORDER — 0.9 % SODIUM CHLORIDE (POUR BTL) OPTIME
TOPICAL | Status: DC | PRN
  Administered 2017-01-20: 1000 mL

## 2017-01-20 MED ORDER — SODIUM CHLORIDE 0.9 % IJ SOLN
INTRAMUSCULAR | Status: DC | PRN
  Administered 2017-01-20: 20 mL

## 2017-01-20 MED ORDER — ROCURONIUM BROMIDE 10 MG/ML (PF) SYRINGE
PREFILLED_SYRINGE | INTRAVENOUS | Status: AC
  Filled 2017-01-20: qty 5

## 2017-01-20 MED ORDER — CYCLOBENZAPRINE HCL 10 MG PO TABS
10.0000 mg | ORAL_TABLET | Freq: Three times a day (TID) | ORAL | Status: DC | PRN
  Administered 2017-01-20 – 2017-01-21 (×3): 10 mg via ORAL
  Filled 2017-01-20 (×3): qty 1

## 2017-01-20 MED ORDER — SENNOSIDES-DOCUSATE SODIUM 8.6-50 MG PO TABS: 1.0000 | ORAL_TABLET | Freq: Every evening | ORAL | Status: DC | PRN

## 2017-01-20 MED ORDER — CYCLOBENZAPRINE HCL 10 MG PO TABS
ORAL_TABLET | ORAL | Status: AC
  Filled 2017-01-20: qty 1

## 2017-01-20 MED ORDER — SENNA 8.6 MG PO TABS
1.0000 | ORAL_TABLET | Freq: Two times a day (BID) | ORAL | Status: DC
  Administered 2017-01-20 – 2017-01-21 (×2): 8.6 mg via ORAL
  Filled 2017-01-20 (×2): qty 1

## 2017-01-20 MED ORDER — PHENYLEPHRINE HCL 10 MG/ML IJ SOLN
INTRAVENOUS | Status: DC | PRN
  Administered 2017-01-20: 35 ug/min via INTRAVENOUS

## 2017-01-20 MED ORDER — BUPIVACAINE HCL (PF) 0.25 % IJ SOLN
INTRAMUSCULAR | Status: AC
  Filled 2017-01-20: qty 30

## 2017-01-20 MED ORDER — LIDOCAINE-EPINEPHRINE 2 %-1:100000 IJ SOLN
INTRAMUSCULAR | Status: AC
  Filled 2017-01-20: qty 1

## 2017-01-20 MED ORDER — SODIUM CHLORIDE 0.9% FLUSH: 3.0000 mL | INTRAVENOUS | Status: DC | PRN

## 2017-01-20 MED ORDER — ROCURONIUM BROMIDE 100 MG/10ML IV SOLN
INTRAVENOUS | Status: DC | PRN
  Administered 2017-01-20 (×2): 20 mg via INTRAVENOUS
  Administered 2017-01-20: 60 mg via INTRAVENOUS
  Administered 2017-01-20: 20 mg via INTRAVENOUS

## 2017-01-20 MED ORDER — SODIUM CHLORIDE 0.9 % IJ SOLN
INTRAMUSCULAR | Status: AC
  Filled 2017-01-20: qty 20

## 2017-01-20 MED ORDER — SUGAMMADEX SODIUM 500 MG/5ML IV SOLN
INTRAVENOUS | Status: DC | PRN
  Administered 2017-01-20: 233.8 mg via INTRAVENOUS

## 2017-01-20 MED ORDER — THROMBIN 5000 UNITS EX SOLR
CUTANEOUS | Status: AC
  Filled 2017-01-20: qty 5000

## 2017-01-20 MED ORDER — SODIUM CHLORIDE 0.9% FLUSH: 3.0000 mL | Freq: Two times a day (BID) | INTRAVENOUS | Status: DC

## 2017-01-20 MED ORDER — PHENYLEPHRINE HCL 10 MG/ML IJ SOLN
INTRAMUSCULAR | Status: DC | PRN
  Administered 2017-01-20: 80 ug via INTRAVENOUS
  Administered 2017-01-20: 40 ug via INTRAVENOUS
  Administered 2017-01-20 (×2): 80 ug via INTRAVENOUS

## 2017-01-20 MED ORDER — ACETAMINOPHEN 325 MG PO TABS: 650.0000 mg | ORAL_TABLET | ORAL | Status: DC | PRN

## 2017-01-20 MED ORDER — PHENOL 1.4 % MT LIQD: 1.0000 | OROMUCOSAL | Status: DC | PRN

## 2017-01-20 MED ORDER — SODIUM CHLORIDE 0.9 % IV SOLN: 250.0000 mL | INTRAVENOUS | Status: DC

## 2017-01-20 MED ORDER — ONDANSETRON HCL 4 MG/2ML IJ SOLN: 4.0000 mg | Freq: Four times a day (QID) | INTRAMUSCULAR | Status: DC | PRN

## 2017-01-20 MED ORDER — SODIUM CHLORIDE 0.9 % IR SOLN
Status: DC | PRN
  Administered 2017-01-20 (×2)

## 2017-01-20 MED ORDER — HYDROCODONE-ACETAMINOPHEN 7.5-325 MG PO TABS
1.0000 | ORAL_TABLET | Freq: Four times a day (QID) | ORAL | Status: DC
  Administered 2017-01-20 – 2017-01-21 (×4): 1 via ORAL
  Filled 2017-01-20 (×4): qty 1

## 2017-01-20 MED ORDER — BISACODYL 10 MG RE SUPP: 10.0000 mg | Freq: Every day | RECTAL | Status: DC | PRN

## 2017-01-20 MED ORDER — CELECOXIB 200 MG PO CAPS
200.0000 mg | ORAL_CAPSULE | ORAL | Status: AC
  Administered 2017-01-20: 200 mg via ORAL
  Filled 2017-01-20: qty 1

## 2017-01-20 MED ORDER — BUPIVACAINE HCL (PF) 0.5 % IJ SOLN
INTRAMUSCULAR | Status: AC
  Filled 2017-01-20: qty 30

## 2017-01-20 MED ORDER — ONDANSETRON HCL 4 MG/2ML IJ SOLN
INTRAMUSCULAR | Status: DC | PRN
  Administered 2017-01-20: 4 mg via INTRAVENOUS

## 2017-01-20 MED ORDER — ZOLPIDEM TARTRATE 5 MG PO TABS: 5.0000 mg | ORAL_TABLET | Freq: Every evening | ORAL | Status: DC | PRN

## 2017-01-20 MED ORDER — LACTATED RINGERS IV SOLN
INTRAVENOUS | Status: DC
  Administered 2017-01-20 (×2): via INTRAVENOUS

## 2017-01-20 MED ORDER — DOCUSATE SODIUM 100 MG PO CAPS
100.0000 mg | ORAL_CAPSULE | Freq: Two times a day (BID) | ORAL | Status: DC
  Administered 2017-01-20 – 2017-01-21 (×2): 100 mg via ORAL
  Filled 2017-01-20 (×2): qty 1

## 2017-01-20 MED ORDER — SODIUM CHLORIDE 0.9 % IV SOLN: INTRAVENOUS | Status: DC

## 2017-01-20 MED ORDER — DEXAMETHASONE SODIUM PHOSPHATE 10 MG/ML IJ SOLN
INTRAMUSCULAR | Status: AC
  Filled 2017-01-20: qty 1

## 2017-01-20 MED ORDER — THROMBIN 20000 UNITS EX SOLR
CUTANEOUS | Status: DC | PRN
  Administered 2017-01-20: 17:00:00 via TOPICAL

## 2017-01-20 MED ORDER — MIDAZOLAM HCL 5 MG/5ML IJ SOLN
INTRAMUSCULAR | Status: DC | PRN
  Administered 2017-01-20: 2 mg via INTRAVENOUS

## 2017-01-20 MED ORDER — VANCOMYCIN HCL 1000 MG IV SOLR
INTRAVENOUS | Status: AC
  Filled 2017-01-20: qty 1000

## 2017-01-20 MED ORDER — SUCCINYLCHOLINE CHLORIDE 200 MG/10ML IV SOSY
PREFILLED_SYRINGE | INTRAVENOUS | Status: AC
  Filled 2017-01-20: qty 10

## 2017-01-20 MED ORDER — THROMBIN 20000 UNITS EX SOLR
CUTANEOUS | Status: AC
  Filled 2017-01-20: qty 20000

## 2017-01-20 MED ORDER — LIDOCAINE-EPINEPHRINE 2 %-1:100000 IJ SOLN
INTRAMUSCULAR | Status: DC | PRN
  Administered 2017-01-20: 15 mL

## 2017-01-20 MED ORDER — PHENYLEPHRINE 40 MCG/ML (10ML) SYRINGE FOR IV PUSH (FOR BLOOD PRESSURE SUPPORT)
PREFILLED_SYRINGE | INTRAVENOUS | Status: AC
  Filled 2017-01-20: qty 10

## 2017-01-20 MED ORDER — ACETAMINOPHEN 650 MG RE SUPP: 650.0000 mg | RECTAL | Status: DC | PRN

## 2017-01-20 MED ORDER — CELECOXIB 200 MG PO CAPS
200.0000 mg | ORAL_CAPSULE | Freq: Two times a day (BID) | ORAL | Status: DC
  Administered 2017-01-20 – 2017-01-21 (×2): 200 mg via ORAL
  Filled 2017-01-20 (×2): qty 1

## 2017-01-20 MED ORDER — OXYCODONE HCL 5 MG PO TABS
5.0000 mg | ORAL_TABLET | ORAL | Status: DC | PRN
  Administered 2017-01-20: 5 mg via ORAL
  Administered 2017-01-21: 10 mg via ORAL
  Filled 2017-01-20: qty 2

## 2017-01-20 MED ORDER — METHYLPREDNISOLONE ACETATE 80 MG/ML IJ SUSP
INTRAMUSCULAR | Status: AC
  Filled 2017-01-20: qty 1

## 2017-01-20 MED ORDER — HYDROMORPHONE HCL 1 MG/ML IJ SOLN
0.2500 mg | INTRAMUSCULAR | Status: DC | PRN
  Administered 2017-01-20 (×2): 0.5 mg via INTRAVENOUS

## 2017-01-20 MED ORDER — OXYCODONE HCL 5 MG PO TABS
ORAL_TABLET | ORAL | Status: AC
  Filled 2017-01-20: qty 1

## 2017-01-20 MED ORDER — SUGAMMADEX SODIUM 500 MG/5ML IV SOLN
INTRAVENOUS | Status: AC
  Filled 2017-01-20: qty 5

## 2017-01-20 MED ORDER — ACETAMINOPHEN 10 MG/ML IV SOLN
INTRAVENOUS | Status: DC | PRN
  Administered 2017-01-20: 1000 mg via INTRAVENOUS

## 2017-01-20 SURGICAL SUPPLY — 73 items
BAG DECANTER FOR FLEXI CONT (MISCELLANEOUS) ×3 IMPLANT
BENZOIN TINCTURE PRP APPL 2/3 (GAUZE/BANDAGES/DRESSINGS) IMPLANT
BLADE CLIPPER SURG (BLADE) ×3 IMPLANT
BLADE SURG 11 STRL SS (BLADE) ×3 IMPLANT
BUR MATCHSTICK NEURO 3.0 LAGG (BURR) ×3 IMPLANT
BUR ROUND FLUTED 5 RND (BURR) ×2 IMPLANT
BUR ROUND FLUTED 5MM RND (BURR) ×1
CANISTER SUCT 3000ML PPV (MISCELLANEOUS) ×6 IMPLANT
CARTRIDGE OIL MAESTRO DRILL (MISCELLANEOUS) ×1 IMPLANT
CHLORAPREP W/TINT 26ML (MISCELLANEOUS) ×3 IMPLANT
CLOSURE WOUND 1/2 X4 (GAUZE/BANDAGES/DRESSINGS)
CONT SPEC 4OZ CLIKSEAL STRL BL (MISCELLANEOUS) ×3 IMPLANT
DECANTER SPIKE VIAL GLASS SM (MISCELLANEOUS) IMPLANT
DERMABOND ADVANCED (GAUZE/BANDAGES/DRESSINGS) ×2
DERMABOND ADVANCED .7 DNX12 (GAUZE/BANDAGES/DRESSINGS) ×1 IMPLANT
DIFFUSER DRILL AIR PNEUMATIC (MISCELLANEOUS) ×3 IMPLANT
DRAPE MICROSCOPE LEICA (MISCELLANEOUS) IMPLANT
DRAPE POUCH INSTRU U-SHP 10X18 (DRAPES) ×3 IMPLANT
DRAPE SURG 17X23 STRL (DRAPES) ×3 IMPLANT
DRSG OPSITE POSTOP 4X10 (GAUZE/BANDAGES/DRESSINGS) ×3 IMPLANT
ELECT BLADE 4.0 EZ CLEAN MEGAD (MISCELLANEOUS) ×3
ELECT REM PT RETURN 9FT ADLT (ELECTROSURGICAL) ×3
ELECTRODE BLDE 4.0 EZ CLN MEGD (MISCELLANEOUS) ×1 IMPLANT
ELECTRODE REM PT RTRN 9FT ADLT (ELECTROSURGICAL) ×1 IMPLANT
EVACUATOR 1/8 PVC DRAIN (DRAIN) ×3 IMPLANT
GAUZE SPONGE 4X4 12PLY STRL (GAUZE/BANDAGES/DRESSINGS) IMPLANT
GAUZE SPONGE 4X4 16PLY XRAY LF (GAUZE/BANDAGES/DRESSINGS) ×3 IMPLANT
GLOVE BIO SURGEON STRL SZ7 (GLOVE) IMPLANT
GLOVE BIO SURGEON STRL SZ7.5 (GLOVE) ×3 IMPLANT
GLOVE BIOGEL PI IND STRL 7.0 (GLOVE) IMPLANT
GLOVE BIOGEL PI IND STRL 7.5 (GLOVE) ×4 IMPLANT
GLOVE BIOGEL PI IND STRL 8 (GLOVE) ×2 IMPLANT
GLOVE BIOGEL PI INDICATOR 7.0 (GLOVE)
GLOVE BIOGEL PI INDICATOR 7.5 (GLOVE) ×8
GLOVE BIOGEL PI INDICATOR 8 (GLOVE) ×4
GLOVE ECLIPSE 7.5 STRL STRAW (GLOVE) ×15 IMPLANT
GLOVE ECLIPSE 9.0 STRL (GLOVE) ×3 IMPLANT
GLOVE SS BIOGEL STRL SZ 7.5 (GLOVE) ×3 IMPLANT
GLOVE SUPERSENSE BIOGEL SZ 7.5 (GLOVE) ×6
GLOVE SURG SS PI 7.0 STRL IVOR (GLOVE) ×6 IMPLANT
GOWN STRL REUS W/ TWL LRG LVL3 (GOWN DISPOSABLE) ×2 IMPLANT
GOWN STRL REUS W/ TWL XL LVL3 (GOWN DISPOSABLE) ×2 IMPLANT
GOWN STRL REUS W/TWL LRG LVL3 (GOWN DISPOSABLE) ×4
GOWN STRL REUS W/TWL XL LVL3 (GOWN DISPOSABLE) ×4
HEMOSTAT POWDER KIT SURGIFOAM (HEMOSTASIS) ×6 IMPLANT
HEMOSTAT POWDER SURGIFOAM 1G (HEMOSTASIS) ×3 IMPLANT
KIT BASIN OR (CUSTOM PROCEDURE TRAY) ×3 IMPLANT
KIT ROOM TURNOVER OR (KITS) ×3 IMPLANT
NEEDLE HYPO 18GX1.5 BLUNT FILL (NEEDLE) IMPLANT
NEEDLE HYPO 21X1.5 SAFETY (NEEDLE) ×6 IMPLANT
NS IRRIG 1000ML POUR BTL (IV SOLUTION) ×3 IMPLANT
OIL CARTRIDGE MAESTRO DRILL (MISCELLANEOUS) ×3
PACK LAMINECTOMY NEURO (CUSTOM PROCEDURE TRAY) ×3 IMPLANT
PACK UNIVERSAL I (CUSTOM PROCEDURE TRAY) ×3 IMPLANT
PAD ARMBOARD 7.5X6 YLW CONV (MISCELLANEOUS) ×9 IMPLANT
PATTIES SURGICAL .5X1.5 (GAUZE/BANDAGES/DRESSINGS) ×3 IMPLANT
RUBBERBAND STERILE (MISCELLANEOUS) IMPLANT
SPONGE SURGIFOAM ABS GEL 100 (HEMOSTASIS) ×3 IMPLANT
STRIP CLOSURE SKIN 1/2X4 (GAUZE/BANDAGES/DRESSINGS) IMPLANT
SUT STRATAFIX 1PDS 45CM VIOLET (SUTURE) ×6 IMPLANT
SUT STRATAFIX MNCRL+ 3-0 PS-2 (SUTURE) ×1
SUT STRATAFIX MONOCRYL 3-0 (SUTURE) ×2
SUT STRATAFIX SPIRAL + 2-0 (SUTURE) ×3 IMPLANT
SUT VIC AB 0 CT1 18XCR BRD8 (SUTURE) IMPLANT
SUT VIC AB 0 CT1 8-18 (SUTURE)
SUT VIC AB 2-0 CT1 18 (SUTURE) IMPLANT
SUT VIC AB 4-0 PS2 27 (SUTURE) IMPLANT
SUTURE STRATFX MNCRL+ 3-0 PS-2 (SUTURE) ×1 IMPLANT
SYR 30ML LL (SYRINGE) ×6 IMPLANT
SYR 5ML LL (SYRINGE) IMPLANT
TOWEL GREEN STERILE (TOWEL DISPOSABLE) ×2 IMPLANT
TOWEL GREEN STERILE FF (TOWEL DISPOSABLE) ×3 IMPLANT
WATER STERILE IRR 1000ML POUR (IV SOLUTION) ×3 IMPLANT

## 2017-01-20 NOTE — Progress Notes (Signed)
Pharmacy Antibiotic Note   Harold Obar. is a 66 y.o. male admitted on 01/20/2017 with surgical prophylaxis.  Pharmacy has been consulted for vancomycin dosing.  Patient got one dose of vancomycin 1500 mg x1 at 1545 on 01/20/17.  CrCl ~77.8 based on SCr on 05/09.  Patient has a drain  Plan: - Start vancomycin 750 mg iv q12h, 1st dose at 0500 in the morning - check a serum creatinine to verify renal status - check vancomycin trough when it's appropriate  Height: 6' (182.9 cm) Weight: 257 lb 11.2 oz (116.9 kg) IBW/kg (Calculated) : 77.6  Temp (24hrs), Avg:97.6 F (36.4 C), Min:97 F (36.1 C), Max:98.3 F (36.8 C)  No results for input(s): WBC, CREATININE, LATICACIDVEN, VANCOTROUGH, VANCOPEAK, VANCORANDOM, GENTTROUGH, GENTPEAK, GENTRANDOM, TOBRATROUGH, TOBRAPEAK, TOBRARND, AMIKACINPEAK, AMIKACINTROU, AMIKACIN in the last 168 hours.  Estimated Creatinine Clearance: 77.8 mL/min (A) (by C-G formula based on SCr of 1.25 mg/dL (H)).    Allergies  Allergen Reactions  . Penicillins Other (See Comments)    Has patient had a PCN reaction causing immediate rash, facial/tongue/throat swelling, SOB or lightheadedness with hypotension: unknown Has patient had a PCN reaction causing severe rash involving mucus membranes or skin necrosis: unknown Has patient had a PCN reaction that required hospitalization unknown Has patient had a PCN reaction occurring within the last 10 years: No If all of the above answers are "NO", then may proceed with Cephalosporin use.     Thank you for allowing pharmacy to be a part of this patient's care.  Celisa Schoenberg, Tsz-Yin 01/20/2017 8:16 PM

## 2017-01-20 NOTE — Transfer of Care (Signed)
Immediate Anesthesia Transfer of Care Note  Patient: Harold Barajas.  Procedure(s) Performed: Procedure(s): Lumbar two to Sacral one Laminectomy for Decompression (N/A)  Patient Location: PACU  Anesthesia Type:General  Level of Consciousness: awake, alert  and oriented  Airway & Oxygen Therapy: Patient Spontanous Breathing and Patient connected to nasal cannula oxygen  Post-op Assessment: Report given to RN and Post -op Vital signs reviewed and stable  Post vital signs: Reviewed and stable  Last Vitals:  Vitals:   01/20/17 1218  BP: (!) 181/96  Pulse: 62  Resp: 18  Temp: 36.8 C    Last Pain:  Vitals:   01/20/17 1218  TempSrc: Oral         Complications: No apparent anesthesia complications

## 2017-01-20 NOTE — Anesthesia Procedure Notes (Signed)
Procedure Name: Intubation Date/Time: 01/20/2017 4:27 PM Performed by: Rejeana Brock L Pre-anesthesia Checklist: Patient identified, Emergency Drugs available, Suction available and Patient being monitored Patient Re-evaluated:Patient Re-evaluated prior to inductionOxygen Delivery Method: Circle System Utilized Preoxygenation: Pre-oxygenation with 100% oxygen Intubation Type: IV induction Ventilation: Mask ventilation without difficulty and Oral airway inserted - appropriate to patient size Laryngoscope Size: Mac and 4 Grade View: Grade III Tube type: Oral Tube size: 8.0 mm Number of attempts: 1 Airway Equipment and Method: Stylet and Oral airway Placement Confirmation: ETT inserted through vocal cords under direct vision,  positive ETCO2 and breath sounds checked- equal and bilateral Secured at: 23 cm Tube secured with: Tape Dental Injury: Teeth and Oropharynx as per pre-operative assessment  Difficulty Due To: Difficult Airway- due to anterior larynx Future Recommendations: Recommend- induction with short-acting agent, and alternative techniques readily available

## 2017-01-20 NOTE — Brief Op Note (Signed)
01/20/2017  6:57 PM  PATIENT:  Harold Barajas.  66 y.o. male  PRE-OPERATIVE DIAGNOSIS:  Lumbosacral Sponylosis with radiculopathy  POST-OPERATIVE DIAGNOSIS:  Lumbosacral Sponylosis with radiculopathy  PROCEDURE:  Procedure(s): Lumbar two to Sacral one Laminectomy for Decompression (N/A)  SURGEON:  Surgeon(s) and Role:    * Ditty, Kevan Ny, MD - Primary     * Earnie Larsson, MD - Assisting  PHYSICIAN ASSISTANT: Ferne Reus, PA-C  ANESTHESIA:   general  EBL:  Total I/O In: 2956 [I.V.:1500; IV Piggyback:250] Out: 430 [Urine:130; Blood:300]  BLOOD ADMINISTERED:none  DRAINS: (1) Jackson-Pratt drain(s) with closed bulb suction in the lumbar spine   LOCAL MEDICATIONS USED:  MARCAINE   , BUPIVICAINE  and LIDOCAINE   SPECIMEN:  No Specimen  DISPOSITION OF SPECIMEN:  N/A  COUNTS:  YES  TOURNIQUET:  * No tourniquets in log *  DICTATION: .Note written in EPIC  PLAN OF CARE: Admit to inpatient   PATIENT DISPOSITION:  PACU - hemodynamically stable.   Delay start of Pharmacological VTE agent (>24hrs) due to surgical blood loss or risk of bleeding: yes

## 2017-01-20 NOTE — H&P (Signed)
CC:  No chief complaint on file. Low back and leg pain  HPI: Harold Barajas. is a 66 y.o. male with lumbosacral spondylosis with radiculopathy presents for elective L2-S1 decompression.  He complains of mostly bilateral upper buttock pain, left thigh pain, and right lower leg pain.  PMH: Past Medical History:  Diagnosis Date  . Angina at rest [facility])   . Arthritis   . Borderline diabetes   . Diplopia 01/02/2016  . GERD (gastroesophageal reflux disease)   . Heart murmur    child  . High cholesterol   . History of rheumatic fever   . Hypertension   . OSA treated with BiPAP    cpap  . Pre-diabetes    been on prednisone  . 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 COLONOSCOPY  2006  . NO PAST SURGERIES    . None      SH: Social History  Substance Use Topics  . Smoking status: Former Smoker    Packs/day: 1.00    Years: 20.00    Quit date: 01/08/1980  . Smokeless tobacco: Never Used     Comment: Quit 1982  . Alcohol use 8.4 oz/week    14 Shots of liquor per week     Comment: 2 glasses of wine per day    MEDS: Prior to Admission medications   Medication Sig Start Date End Date Taking? Authorizing Provider  aspirin 81 MG tablet Take 81 mg by mouth daily.   Yes [provider]  cetirizine (ZYRTEC) 10 MG tablet Take 10 mg by mouth daily. 12/13/15  Yes [provider]  lisinopril (PRINIVIL,ZESTRIL) 20 MG tablet Take 20 mg by mouth 2 (two) times daily. 11/30/16  Yes [provider]  meloxicam (MOBIC) 7.5 MG tablet Take 7.5 mg by mouth 2 (two) times daily. 11/30/16  Yes [provider]  metoprolol (LOPRESSOR) 50 MG tablet Take 50 mg by mouth 2 (two) times daily.  12/13/15  Yes [provider]  omeprazole (PRILOSEC) 20 MG capsule Take 20 mg by mouth daily.   Yes [provider]  simvastatin (ZOCOR) 40 MG tablet Take 20 mg by mouth daily at 6 PM. Reported on 01/02/2016 10/28/15   Yes [provider]    ALLERGY: Allergies  Allergen Reactions  . Penicillins Other (See Comments)    Has patient had a PCN reaction causing immediate rash, facial/tongue/throat swelling, SOB or lightheadedness with hypotension: unknown Has patient had a PCN reaction causing severe rash involving mucus membranes or skin necrosis: unknown Has patient had a PCN reaction that required hospitalization unknown Has patient had a PCN reaction occurring within the last 10 years: No If all of the above answers are "NO", then may proceed with Cephalosporin use.     ROS: ROS  NEUROLOGIC EXAM: Awake, alert, oriented Memory and concentration grossly intact Speech fluent, appropriate CN grossly intact Motor exam: Upper Extremities Deltoid Bicep Tricep Grip  Right 5/5 5/5 5/5 5/5  Left 5/5 5/5 5/5 5/5   Lower Extremity IP Quad PF DF EHL  Right 5/5 5/5 5/5 5/5 5/5  Left 5/5 5/5 5/5 5/5 5/5   Sensation grossly intact to LT  IMAGING: No new imaging  IMPRESSION: - 66 y.o. male with lumbosacral spondylosis with radiculopathy and lumbar stenosis L2-S1.  PLAN: - L2-S1 laminectomy for decompression - We have discussed the risks, benefits, and alternatives to surgery in clinic.  He  does not have any additional questions today.

## 2017-01-20 NOTE — Anesthesia Preprocedure Evaluation (Addendum)
Anesthesia Evaluation  Patient identified by MRN, date of birth, ID band Patient awake    Reviewed: Allergy & Precautions, NPO status , Patient's Chart, lab work & pertinent test results  Airway Mallampati: II  TM Distance: >3 FB Neck ROM: Full    Dental  (+) Dental Advisory Given   Pulmonary sleep apnea , former smoker,    Pulmonary exam normal        Cardiovascular hypertension, Pt. on medications + CAD  Normal cardiovascular exam     Neuro/Psych  Neuromuscular disease    GI/Hepatic Neg liver ROS, GERD  ,  Endo/Other  negative endocrine ROS  Renal/GU negative Renal ROS     Musculoskeletal  (+) Arthritis ,   Abdominal   Peds  Hematology negative hematology ROS (+)   Anesthesia Other Findings   Reproductive/Obstetrics                            Lab Results  Component Value Date   WBC 6.8 01/07/2017   HGB 15.6 01/07/2017   HCT 44.2 01/07/2017   MCV 90.0 01/07/2017   PLT 195 01/07/2017   Lab Results  Component Value Date   CREATININE 1.25 (H) 01/07/2017   BUN 15 01/07/2017   NA 136 01/07/2017   K 4.3 01/07/2017   CL 103 01/07/2017   CO2 25 01/07/2017    Anesthesia Physical Anesthesia Plan  ASA: III  Anesthesia Plan: General   Post-op Pain Management:    Induction: Intravenous  Airway Management Planned: Oral ETT  Additional Equipment:   Intra-op Plan:   Post-operative Plan: Extubation in OR  Informed Consent: I have reviewed the patients History and Physical, chart, labs and discussed the procedure including the risks, benefits and alternatives for the proposed anesthesia with the patient or authorized representative who has indicated his/her understanding and acceptance.   Dental advisory given  Plan Discussed with: CRNA  Anesthesia Plan Comments:         Anesthesia Quick Evaluation

## 2017-01-20 NOTE — Anesthesia Postprocedure Evaluation (Addendum)
Anesthesia Post Note  Patient: Harold Barajas.  Procedure(s) Performed: Procedure(s) (LRB): Lumbar two to Sacral one Laminectomy for Decompression (N/A)  Patient location during evaluation: PACU Anesthesia Type: General Level of consciousness: awake, awake and alert and oriented Pain management: pain level controlled Vital Signs Assessment: post-procedure vital signs reviewed and stable Respiratory status: spontaneous breathing, nonlabored ventilation and respiratory function stable Cardiovascular status: blood pressure returned to baseline Anesthetic complications: no       Last Vitals:  Vitals:   01/20/17 2005 01/20/17 2020  BP:  130/68  Pulse: 74 64  Resp: 15 20  Temp: 36.1 C 36.8 C    Last Pain:  Vitals:   01/20/17 2030  TempSrc:   PainSc: 5                  Betsy Rosello COKER

## 2017-01-21 ENCOUNTER — Encounter (HOSPITAL_COMMUNITY): Payer: Self-pay | Admitting: Neurological Surgery

## 2017-01-21 DIAGNOSIS — M4727 Other spondylosis with radiculopathy, lumbosacral region: Secondary | ICD-10-CM | POA: Diagnosis not present

## 2017-01-21 LAB — BASIC METABOLIC PANEL
Anion gap: 8 (ref 5–15)
BUN: 19 mg/dL (ref 6–20)
CO2: 26 mmol/L (ref 22–32)
Calcium: 8.3 mg/dL — ABNORMAL LOW (ref 8.9–10.3)
Chloride: 100 mmol/L — ABNORMAL LOW (ref 101–111)
Creatinine, Ser: 1.3 mg/dL — ABNORMAL HIGH (ref 0.61–1.24)
GFR calc Af Amer: >60 mL/min (ref >60)
GFR calc non Af Amer: 56 mL/min — ABNORMAL LOW (ref >60)
Glucose, Bld: 208 mg/dL — ABNORMAL HIGH (ref 65–99)
Potassium: 5.2 mmol/L — ABNORMAL HIGH (ref 3.5–5.1)
Sodium: 134 mmol/L — ABNORMAL LOW (ref 135–145)

## 2017-01-21 LAB — CBC
HCT: 36.3 % — ABNORMAL LOW (ref 39.0–52.0)
Hemoglobin: 12.6 g/dL — ABNORMAL LOW (ref 13.0–17.0)
MCH: 31.3 pg (ref 26.0–34.0)
MCHC: 34.7 g/dL (ref 30.0–36.0)
MCV: 90.1 fL (ref 78.0–100.0)
Platelets: 187 10*3/uL (ref 150–400)
RBC: 4.03 MIL/uL — ABNORMAL LOW (ref 4.22–5.81)
RDW: 12.2 % (ref 11.5–15.5)
WBC: 11.1 10*3/uL — ABNORMAL HIGH (ref 4.0–10.5)

## 2017-01-21 LAB — PROTIME-INR
INR: 1.08
Prothrombin Time: 14.1 seconds (ref 11.4–15.2)

## 2017-01-21 LAB — APTT: aPTT: 29 s (ref 24–36)

## 2017-01-21 MED ORDER — CYCLOBENZAPRINE HCL 10 MG PO TABS: 10.0000 mg | ORAL_TABLET | Freq: Three times a day (TID) | ORAL | 0 refills | Status: DC | PRN

## 2017-01-21 MED ORDER — HYDROCODONE-ACETAMINOPHEN 7.5-325 MG PO TABS: 1.0000 | ORAL_TABLET | ORAL | 0 refills | Status: DC | PRN

## 2017-01-21 MED ORDER — GABAPENTIN 300 MG PO CAPS: 300.0000 mg | ORAL_CAPSULE | Freq: Three times a day (TID) | ORAL | 2 refills | Status: DC

## 2017-01-21 MED FILL — Thrombin For Soln 5000 Unit: CUTANEOUS | Qty: 5000 | Status: AC

## 2017-01-21 MED FILL — Gelatin Absorbable MT Powder: OROMUCOSAL | Qty: 1 | Status: AC

## 2017-01-21 NOTE — Progress Notes (Signed)
Patient alert and oriented, mae's well, voiding adequate amount of urine, swallowing without difficulty, no c/o pain at time of discharge. Patient discharged home with family. Script and discharged instructions given to patient. Patient and family stated understanding of instructions given. Patient has an appointment with Dr. Ditty  

## 2017-01-21 NOTE — Progress Notes (Signed)
Back soreness as expected Moving legs well D/c today

## 2017-01-21 NOTE — Evaluation (Signed)
Physical Therapy Evaluation Patient Details Name: Harold Barajas. MRN: [id number] DOB: 1950/09/17 Today's Date: 01/21/2017   History of Present Illness  Pt is a 66 y/o male who presents s/p L2-S1 laminectomy/decompression on 01/20/17.  Clinical Impression  Pt admitted with above diagnosis. Pt currently with functional limitations due to the deficits listed below (see PT Problem List). At the time of PT eval pt was able to perform transfers and ambulation with gross min guard assist. Pt with increased pain without RW however feel it made him slow down and focus on precautions and posture more. Was unsafe with RW. Pt will benefit from skilled PT to increase their independence and safety with mobility to allow discharge to the venue listed below.       Follow Up Recommendations No PT follow up;Supervision for mobility/OOB    Equipment Recommendations  3in1 (PT);Cane    Recommendations for Other Services       Precautions / Restrictions Precautions Precautions: Fall;Back Precaution Booklet Issued: Yes (comment) Precaution Comments: Reviewed handout and pt was cued for precautions during functional mobility.  Required Braces or Orthoses: Spinal Brace Spinal Brace: Lumbar corset;Applied in sitting position (Not present during eval - per RN ok to walk without) Restrictions Weight Bearing Restrictions: No      Mobility  Bed Mobility Overal bed mobility: Needs Assistance Bed Mobility: Rolling;Sidelying to Sit Rolling: Supervision Sidelying to sit: Min guard       General bed mobility comments: Close guard for safety as pt transitioned to EOB. VC's for log roll technique. HOB flat and rails lowered to simulate home environment.   Transfers Overall transfer level: Needs assistance Equipment used: Rolling walker (2 wheeled) Transfers: Sit to/from Stand Sit to Stand: Min guard         General transfer comment: VC's for hand placement on seated surface for safety. Pt was able to  power-up to full stand without physical assist. Initially stood to walker, stand>sit later in session with SPC.   Ambulation/Gait Ambulation/Gait assistance: Min guard Ambulation Distance (Feet): 300 Feet Assistive device: Rolling walker (2 wheeled);Straight cane Gait Pattern/deviations: Step-through pattern;Decreased stride length;Trunk flexed Gait velocity: Normal Gait velocity interpretation: at or above normal speed for age/gender General Gait Details: VC's for improved posture and safe gait speed. With walker initially, pt ambulating at an unsafe pace, so quickly at times wheels on walker were shaking. Was not slowing down with cues. Replaced RW with SPC and discussed safe speed. Pt was able to ambulate well with SPC, slowing down due to pain and proper form.   Stairs            Wheelchair Mobility    Modified Rankin (Stroke Patients Only)       Balance Overall balance assessment: No apparent balance deficits (not formally assessed)                                           Pertinent Vitals/Pain Pain Assessment: 0-10 Pain Score: 6  Pain Location: Incision site - L Hamstring/Buttock, R quad Pain Descriptors / Indicators: Operative site guarding;Aching Pain Intervention(s): Limited activity within patient's tolerance;Monitored during session;Repositioned    Home Living Family/patient expects to be discharged to:: Private residence Living Arrangements: Spouse/significant other Available Help at Discharge: Family;Available 24 hours/day (initially) Type of Home: House Home Access: Stairs to enter   [employer] of Steps: 1 Home Layout: One level  Home Equipment: Adaptive equipment      Prior Function Level of Independence: Independent               Hand Dominance   Dominant Hand: Right    Extremity/Trunk Assessment   Upper Extremity Assessment Upper Extremity Assessment: Defer to OT evaluation    Lower Extremity  Assessment Lower Extremity Assessment: Generalized weakness (Consistent with pre-op diagnosis)    Cervical / Trunk Assessment Cervical / Trunk Assessment: Other exceptions Cervical / Trunk Exceptions: s/p surgery  Communication   Communication: No difficulties  Cognition Arousal/Alertness: Awake/alert Behavior During Therapy: WFL for tasks assessed/performed Overall Cognitive Status: Within Functional Limits for tasks assessed                                        General Comments      Exercises     Assessment/Plan    PT Assessment Patient needs continued PT services  PT Problem List Decreased strength;Decreased range of motion;Decreased balance;Decreased activity tolerance;Decreased mobility;Decreased knowledge of use of DME;Decreased safety awareness;Decreased knowledge of precautions;Pain       PT Treatment Interventions DME instruction;Gait training;Stair training;Functional mobility training;Therapeutic activities;Therapeutic exercise;Neuromuscular re-education;Patient/family education    PT Goals (Current goals can be found in the Care Plan section)  Acute Rehab PT Goals Patient Stated Goal: Home tomorrow PT Goal Formulation: With patient/family Time For Goal Achievement: 01/28/17 Potential to Achieve Goals: Good    Frequency Min 5X/week   Barriers to discharge        Co-evaluation               AM-PAC PT "6 Clicks" Daily Activity  Outcome Measure Difficulty turning over in bed (including adjusting bedclothes, sheets and blankets)?: A Little Difficulty moving from lying on back to sitting on the side of the bed? : A Little Difficulty sitting down on and standing up from a chair with arms (e.g., wheelchair, bedside commode, etc,.)?: A Little Help needed moving to and from a bed to chair (including a wheelchair)?: A Little Help needed walking in hospital room?: A Little Help needed climbing 3-5 steps with a railing? : A Little 6 Click  Score: 18    End of Session Equipment Utilized During Treatment: Gait belt Activity Tolerance: Patient tolerated treatment well Patient left: in chair;with call bell/phone within reach;with family/visitor present Nurse Communication: Mobility status PT Visit Diagnosis: Pain;Other abnormalities of gait and mobility (R26.89) Pain - part of body:  (Back)    Time: 1103-1594 PT Time Calculation (min) (ACUTE ONLY): 32 min   Charges:   PT Evaluation $PT Eval Moderate Complexity: 1 Procedure PT Treatments $Gait Training: 8-22 mins   PT G Codes:        Rolinda Roan, PT, DPT Acute Rehabilitation Services Pager: [phone]   Thelma Comp 01/21/2017, 10:13 AM

## 2017-01-21 NOTE — Progress Notes (Signed)
Occupational Therapy Evaluation Patient Details Name: Harold Barajas. MRN: [id number] DOB: [birth year] Today's Date: 01/21/2017    History of Present Illness Pt is a 66 y/o male who presents s/p L2-S1 laminectomy/decompression on 01/20/17.   Clinical Impression   Completed all education regarding compensatory techniques for ADL and functional mobility with use of AE as needed and DME. Educated on home safety and reducing risk of falls. Pt/family verbalized understanding. Pt safe to DC home when medically stable.    Follow Up Recommendations  No OT follow up;Supervision - Intermittent    Equipment Recommendations  3 in 1 bedside commode    Recommendations for Other Services       Precautions / Restrictions Precautions Precautions: Fall;Back Precaution Booklet Issued: Yes (comment) Precaution Comments: Reviewed handout Required Braces or Orthoses: Spinal Brace Spinal Brace: Lumbar corset;Applied in sitting position Restrictions Weight Bearing Restrictions: No      Mobility Bed Mobility Overal bed mobility: Needs Assistance Bed Mobility: Rolling;Sidelying to Sit Rolling: Supervision        General bed mobility comments: good carry over from earlier session  Transfers Overall transfer level: Needs assistance Equipment used: Rolling walker (2 wheeled) Transfers: Sit to/from Stand Sit to Stand: Supervision         General transfer comment: vc for controlled descent to chair    Balance Overall balance assessment: No apparent balance deficits (not formally assessed)                                         ADL either performed or assessed with clinical judgement   ADL Overall ADL's : Needs assistance/impaired     Grooming: Supervision/safety   Upper Body Bathing: Supervision/ safety;Set up   Lower Body Bathing: Supervison/ safety;Set up;Sit to/from stand   Upper Body Dressing : Supervision/safety;Set up;Sitting   Lower Body Dressing:  Minimal assistance;Sit to/from stand   Toilet Transfer: Ambulation;Supervision/safety           Functional mobility during ADLs: Supervision/safety General ADL Comments: Educated pt/family on compensatory techqnieus for ADL following back precautions. Recommend pt have reacher to use at home. Educated on home safety, reducing risk of falls. Wife with multiple questions regarding care post DC. Questions answered. discussed options of using 3 in1 as shower chair and safe transfer options.      Vision         Perception     Praxis      Pertinent Vitals/Pain Pain Assessment: 0-10 Pain Score: 6  Pain Location: Incision site - L Hamstring/Buttock, R quad Pain Descriptors / Indicators: Operative site guarding;Aching Pain Intervention(s): Limited activity within patient's tolerance;Patient requesting pain meds-RN notified;RN gave pain meds during session     Hand Dominance Right   Extremity/Trunk Assessment Upper Extremity Assessment Upper Extremity Assessment: Overall WFL for tasks assessed   Lower Extremity Assessment Lower Extremity Assessment: Defer to PT evaluation   Cervical / Trunk Assessment Cervical / Trunk Assessment: Other exceptions Cervical / Trunk Exceptions: s/p surgery   Communication Communication Communication: No difficulties   Cognition Arousal/Alertness: Awake/alert Behavior During Therapy: WFL for tasks assessed/performed Overall Cognitive Status: Within Functional Limits for tasks assessed                                     General Comments  Exercises     Shoulder Instructions      Home Living Family/patient expects to be discharged to:: Private residence Living Arrangements: Spouse/significant other Available Help at Discharge: Family;Available 24 hours/day (initially) Type of Home: House Home Access: Stairs to enter [employer] of Steps: 1   Home Layout: One level     Bathroom Shower/Tub: Arts administrator: Handicapped height Bathroom Accessibility: Yes How Accessible: Accessible via walker Home Equipment: Radiation protection practitioner Equipment: Reacher        Prior Functioning/Environment Level of Independence: Independent                 OT Problem List: Decreased strength;Decreased safety awareness;Decreased knowledge of use of DME or AE;Decreased knowledge of precautions;Obesity;Pain      OT Treatment/Interventions:      OT Goals(Current goals can be found in the care plan section) Acute Rehab OT Goals Patient Stated Goal: to return home OT Goal Formulation: All assessment and education complete, DC therapy  OT Frequency:     Barriers to D/C:            Co-evaluation              AM-PAC PT "6 Clicks" Daily Activity     Outcome Measure Help from another person eating meals?: None Help from another person taking care of personal grooming?: None Help from another person toileting, which includes using toliet, bedpan, or urinal?: A Little Help from another person bathing (including washing, rinsing, drying)?: A Little Help from another person to put on and taking off regular upper body clothing?: None Help from another person to put on and taking off regular lower body clothing?: A Little 6 Click Score: 21   End of Session Equipment Utilized During Treatment: Back brace Nurse Communication: Mobility status  Activity Tolerance: Patient tolerated treatment well Patient left: in chair;with call bell/phone within reach;with family/visitor present  OT Visit Diagnosis: Unsteadiness on feet (R26.81);Pain Pain - part of body:  (back)                Time: 1610-9604 OT Time Calculation (min): 29 min Charges:  OT General Charges $OT Visit: 1 Procedure OT Evaluation $OT Eval Low Complexity: 1 Procedure OT Treatments $Self Care/Home Management : 8-22 mins G-Codes:     [facility], OT/L  [phone] 01/21/2017  Harold Barajas,HILLARY 01/21/2017,  10:53 AM

## 2017-01-21 NOTE — Discharge Summary (Signed)
Physician Discharge Summary  Patient ID: Harold Barajas. MRN: [id number] DOB/AGE: [birth year] 66 y.o.  Admit date: 01/20/2017 Discharge date: 01/21/2017  Admission Diagnoses:  Lumbar radiculopathy  Discharge Diagnoses:  Same Active Problems:   Lumbar radiculopathy  Discharged Condition: Stable  Hospital Course:  Harold Hereford. is a 66 y.o. male who was admitted for the below procedure. There were no post operative complications. At time of discharge, pain was well controlled, ambulating with Pt/OT, tolerating po, voiding normal. Ready for discharge.  Treatments: Surgery - Lumbar two to Sacral one Laminectomy for Decompression (N/A)  Discharge Exam: Blood pressure 127/75, pulse 87, temperature 98 F (36.7 C), temperature source Oral, resp. rate 18, height 6' (1.829 m), weight 116.9 kg (257 lb 11.2 oz), SpO2 99 %. Awake, alert, oriented Speech fluent, appropriate CN grossly intact MAEW Wound c/d/i  Disposition: Final discharge disposition not confirmed  Discharge Instructions    Call MD for:  difficulty breathing, headache or visual disturbances    Complete by:  As directed    Call MD for:  persistant dizziness or light-headedness    Complete by:  As directed    Call MD for:  redness, tenderness, or signs of infection (pain, swelling, redness, odor or green/yellow discharge around incision site)    Complete by:  As directed    Call MD for:  severe uncontrolled pain    Complete by:  As directed    Call MD for:  temperature >100.4    Complete by:  As directed    Diet general    Complete by:  As directed    Driving Restrictions    Complete by:  As directed    Do not drive until given clearance.   Incentive spirometry RT    Complete by:  As directed    Increase activity slowly    Complete by:  As directed    Lifting restrictions    Complete by:  As directed    Do not lift anything >10lbs. Avoid bending and twisting in awkward positions. Avoid bending at the back.    May shower / Bathe    Complete by:  As directed    In 24 hours. Okay to wash wound with warm soapy water. Avoid scrubbing the wound. Pat dry.   Remove dressing in 24 hours    Complete by:  As directed      Allergies as of 01/21/2017      Reactions   Penicillins Other (See Comments)   Has patient had a PCN reaction causing immediate rash, facial/tongue/throat swelling, SOB or lightheadedness with hypotension: unknown Has patient had a PCN reaction causing severe rash involving mucus membranes or skin necrosis: unknown Has patient had a PCN reaction that required hospitalization unknown Has patient had a PCN reaction occurring within the last 10 years: No If all of the above answers are "NO", then may proceed with Cephalosporin use.      Medication List    TAKE these medications   aspirin 81 MG tablet Take 81 mg by mouth daily.   cetirizine 10 MG tablet Commonly known as:  ZYRTEC Take 10 mg by mouth daily.   cyclobenzaprine 10 MG tablet Commonly known as:  FLEXERIL Take 1 tablet (10 mg total) by mouth 3 (three) times daily as needed for muscle spasms.   gabapentin 300 MG capsule Commonly known as:  NEURONTIN Take 1 capsule (300 mg total) by mouth 3 (three) times daily.   HYDROcodone-acetaminophen 7.5-325 MG tablet Commonly  known as:  NORCO Take 1 tablet by mouth every 4 (four) hours as needed for moderate pain.   lisinopril 20 MG tablet Commonly known as:  PRINIVIL,ZESTRIL Take 20 mg by mouth 2 (two) times daily.   meloxicam 7.5 MG tablet Commonly known as:  MOBIC Take 7.5 mg by mouth 2 (two) times daily.   metoprolol tartrate 50 MG tablet Commonly known as:  LOPRESSOR Take 50 mg by mouth 2 (two) times daily.   omeprazole 20 MG capsule Commonly known as:  PRILOSEC Take 20 mg by mouth daily.   simvastatin 40 MG tablet Commonly known as:  ZOCOR Take 20 mg by mouth daily at 6 PM. Reported on 01/02/2016            Durable Medical Equipment        Start      Ordered   01/21/17 1203  DME 3-in-1  Once     01/21/17 1202   01/21/17 1010  For home use only DME 3 n 1  Once     01/21/17 1009     Follow-up Information    Ditty, Kevan Ny, MD. Schedule an appointment as soon as possible for a visit in 3 week(s).   Specialty:  Neurosurgery Contact information: [address] [place] [address] [phone]           Signed: Traci Sermon 01/21/2017, 12:02 PM

## 2017-01-30 NOTE — Op Note (Signed)
01/20/2017  2:19 PM  PATIENT:  Harold Barajas.  66 y.o. male  PRE-OPERATIVE DIAGNOSIS:  Lumbosacral spondylosis with radiculopathy, lumbar stenosis L2-S1  POST-OPERATIVE DIAGNOSIS:  Same  PROCEDURE:  L2-S1 laminectomy for decompression  SURGEON:  Aldean Ast, MD  ASSISTANTS: Earnie Larsson, MD  ANESTHESIA:   General  DRAINS: Medium hemovac   SPECIMEN:  None  INDICATION FOR PROCEDURE: 66 year old male with lumbosacral spondylosis and radiculopathy.  I recommended the above operation. Patient understood the risks, benefits, and alternatives and potential outcomes and wished to proceed.  PROCEDURE DETAILS: After smooth induction of general endotracheal anesthesia the patient was turned prone on the operating table on a Wilson frame. The skin of the lumbar region was clipped of hair. It was wiped down with alcohol. The patient was then prepped and draped in usual sterile fashion.   The subcutaneous tissues of the midline from approximately L2 to S1 was infiltrated with Marcaine. The skin in this area was opened sharply. The soft tissues were dissected with monopolar cautery. Subperiosteal dissection was carried out along the sides of the spinous processes and the laminar surfaces to the medial edge of the facet joints from L2 to S1. The spinous processes were removed with rongeurs. The laminae were then thinned with a high-speed bur. The remaining lamina was resected with a Kerrison punch. Thickened ligamentum flavum was separated from the dura and resected with a Kerrison rongeur. The thecal sac was further freed from the hypertrophied ligament in the lateral recesses.  Lateral recess decompression was completed. Decompression was carried out laterally to the foramina. The foramina were palpated and found to be without residual stenosis.   I irrigated vigorously with bacitracin saline. There was excellent hemostasis. A medium hemovac drain was placed below the fascia. I placed  vancomycin powder in the depths of the wounds.The wound was then closed in routine anatomic layers with running stratafix suture. I injected exparel in the paraspinous muscles. The skin was closed with a running Monocryl strata fix suture. It was then sealed with Dermabond. The patient was turned to the supine position and allowed to awaken from anesthesia.   PATIENT DISPOSITION:  PACU - hemodynamically stable.   Delay start of Pharmacological VTE agent (>24hrs) due to surgical blood loss or risk of bleeding:  yes

## 2017-04-23 NOTE — Addendum Note (Signed)
Addendum  created 04/23/17 1456 by Roberts Gaudy, MD   Sign clinical note

## 2018-12-08 IMAGING — CR DG LUMBAR SPINE 1V
1 series · 1 of 1 positions shown · non-contrast
Comparison: None.

CLINICAL DATA: Localization image for L2-S1 laminectomy. Initial
encounter.

EXAM:
LUMBAR SPINE - 1 VIEW

[lateral]
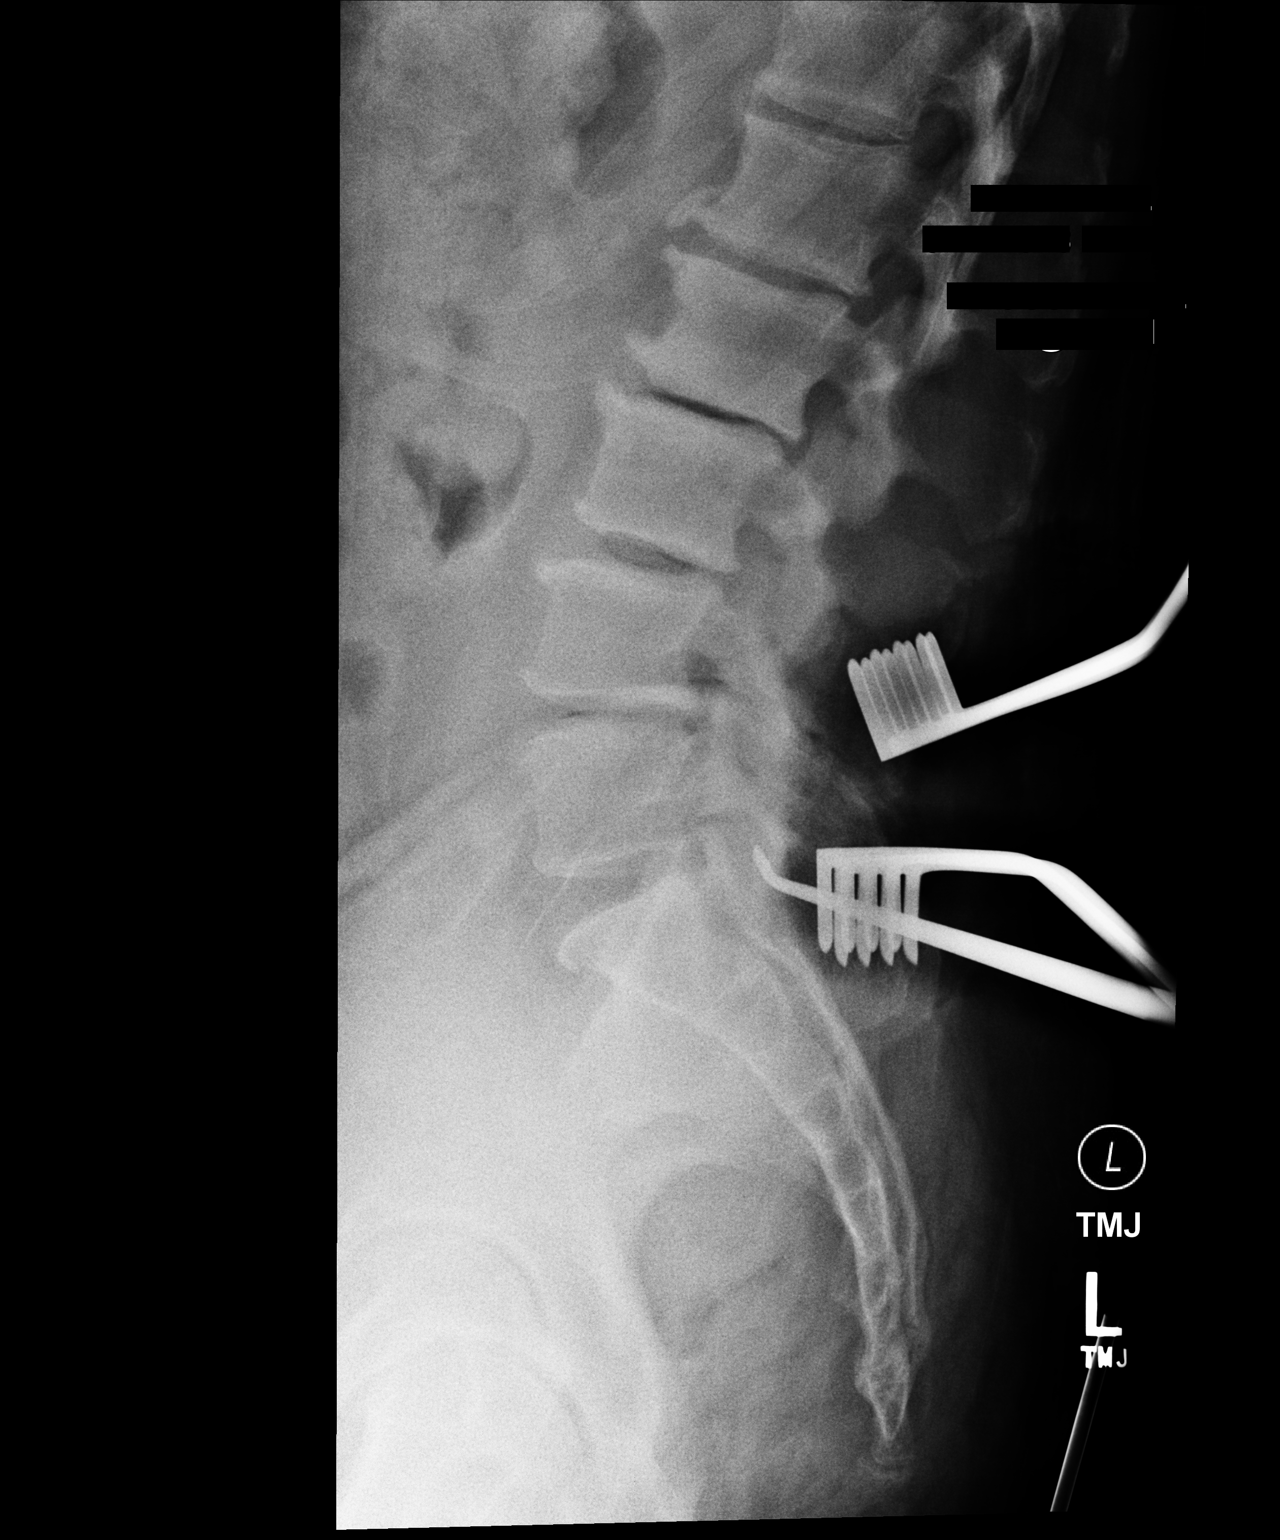

[1 of 1 positions shown; findings below may reference images not displayed]

FINDINGS: Multilevel vacuum phenomenon is noted along the lumbar spine, with
associated endplate sclerosis. Mild facet disease is noted at the
lower lumbar spine. Instrumentation is noted overlying L4-S1.
Underlying bony foraminal narrowing is noted at the lower lumbar
spine.
IMPRESSION: Mild degenerative change along the lumbar spine. Instrumentation
noted overlying L4-S1.

## 2020-01-02 ENCOUNTER — Encounter: Payer: Self-pay | Admitting: Gastroenterology

## 2020-02-02 ENCOUNTER — Telehealth: Payer: Self-pay | Admitting: *Deleted

## 2020-02-02 NOTE — Telephone Encounter (Signed)
Spoke to pt about needing an OV-  Explained need due to difficult intubation-pt will CB Friday to Schedule OV- PV and colon canceled for June - pt states he has not had an Echo with Dr Jari Pigg , cardiologist in [place]- he has had an recent OV with him - asked pt to obtain last cardiology Office note

## 2020-02-02 NOTE — Telephone Encounter (Signed)
Reviewing pt's chart for upcoming PV-  Pt had a difficult intubation 01-20-2017- the anesthesia note states difficult airway- recommendation of induction with short acting agents and alternative techniques readily available-    Pt will need an OV prior to colon since he has no GI hx here-  Also he has angina at rest and we have no cardiology notes/ procedures available- pt lives in [place] - we need to get cardio records as well .  LMTRC to schedule  OV with Dr Havery Moros on pt's cell and home number- Lel[facility] Pons PV

## 2020-02-03 ENCOUNTER — Encounter: Payer: Self-pay | Admitting: *Deleted

## 2020-02-03 DIAGNOSIS — T884XXA Failed or difficult intubation, initial encounter: Secondary | ICD-10-CM

## 2020-02-03 HISTORY — DX: Failed or difficult intubation, initial encounter: T88.4XXA

## 2020-02-03 NOTE — Telephone Encounter (Signed)
7-30 3 pm OV with Dr Havery Moros- pt to get last Echo and last cardio note from [facility] in [place] to bring to [place] - mailed appt date time to pt

## 2020-02-29 ENCOUNTER — Encounter: Payer: Medicare Other | Admitting: Gastroenterology

## 2020-03-07 NOTE — Telephone Encounter (Signed)
Records received from Dr. Delanna Notice of [facility] in [place].  ECHO on 02-25-20 showed EF of 55%.  Note from Dr Rosalita Chessman said he is cleared for endoscopic procedure from a cardiac stand point.  Last seen in the Office on 01-17-20.  Records will be available for Dr. Doyne Keel review at [place] on 03-30-20.

## 2020-03-30 ENCOUNTER — Encounter: Payer: Self-pay | Admitting: Gastroenterology

## 2020-03-30 ENCOUNTER — Ambulatory Visit (INDEPENDENT_AMBULATORY_CARE_PROVIDER_SITE_OTHER): Payer: Medicare Other | Admitting: Gastroenterology

## 2020-03-30 VITALS — BP 144/76 | HR 55 | Ht 72.0 in | Wt 238.6 lb

## 2020-03-30 DIAGNOSIS — Z1211 Encounter for screening for malignant neoplasm of colon: Secondary | ICD-10-CM

## 2020-03-30 DIAGNOSIS — K219 Gastro-esophageal reflux disease without esophagitis: Secondary | ICD-10-CM | POA: Diagnosis not present

## 2020-03-30 DIAGNOSIS — T884XXS Failed or difficult intubation, sequela: Secondary | ICD-10-CM

## 2020-03-30 NOTE — Progress Notes (Signed)
HPI :  69 y/o male with a history of CAD, OSA, GERD, referred by Eulogio Ditch NP for colon cancer screening.  The patient also has a history of longstanding reflux.  He states has been on omeprazole 20 mg a day for the past several years, he thinks at least 5 years.  His main symptoms are pyrosis.  He states the omeprazole works pretty well to control this.  No dysphagia, no nausea, no vomiting.  He did smoke tobacco for 15 years, quit several years ago.  He denies any family history of esophageal cancer.  He is never had a upper endoscopy before for screening for Barrett's.  His last colonoscopy was in 2006.  He states it was normal and there were no problems with it.  He denies any problems with his bowels, no blood in the stools, no abdominal pains.  No family history of colon cancer.  His father did have Crohn's disease diagnosed in his 52s.  Of note the patient had lumbar laminectomy in 2018, per anesthesia notes he had an anterior larynx and was a difficult airway.  Given these findings he is not a candidate for procedures at the [facility].  He does have a history of coronary artery disease he had an echo in June showed an EF of 55%.  He denies any cardiopulmonary symptoms.  His cardiologist Dr. Delanna Notice provided a letter today stating he is cleared for endoscopic procedures.   ECHO on 02-25-20 showed EF of 55%  Letter from Dr. Delanna Notice 03/06/20 - clearance for colonoscopy   Past Medical History:  Diagnosis Date  . Angina at rest [facility])   . Arthritis   . Borderline diabetes   . Coronary artery disease    "leaky valve"  . 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 Diplopia 01/02/2016  . GERD (gastroesophageal reflux disease)   . Heart murmur    child  . High cholesterol   . History of rheumatic fever   . Hypertension   . OSA treated with BiPAP    cpap  . Pre-diabetes    been on prednisone  . 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  COLONOSCOPY  2006  . LUMBAR LAMINECTOMY/DECOMPRESSION MICRODISCECTOMY N/A 01/20/2017   Procedure: Lumbar two to Sacral one Laminectomy for Decompression;  Surgeon: Ditty, Kevan Ny, MD;  Location: [place];  Service: Neurosurgery;  Laterality: N/A;  . None     Family History  Problem Relation Age of Onset  . Stroke Mother   . Heart attack Mother   . Aneurysm Mother        cerebral  . Aneurysm Father   . Crohn's disease Father   . Diabetes Brother   . Heart attack Brother   . Leukemia Maternal Grandmother   . Colon cancer Neg Hx   . Rectal cancer Neg Hx   . Esophageal cancer Neg Hx    Social History   Tobacco Use  . Smoking status: Former Smoker    Packs/day: 1.00    Years: 20.00    Pack years: 20.00    Quit date: 01/08/1980    Years since quitting: 40.2  . Smokeless tobacco: Never Used  . Tobacco comment: Quit 1982  Vaping Use  . Vaping Use: Never used  Substance Use Topics  . Alcohol use: Yes    Comment: social  . Drug use: No   Current Outpatient Medications  Medication Sig  Dispense Refill  . aspirin 81 MG tablet Take 81 mg by mouth daily.    . carvedilol (COREG) 25 MG tablet Take 25 mg by mouth 2 (two) times daily.    . cetirizine (ZYRTEC) 10 MG tablet Take 10 mg by mouth daily.  3  . meloxicam (MOBIC) 7.5 MG tablet Take 7.5 mg by mouth daily.     Marland Kitchen olmesartan (BENICAR) 40 MG tablet Take 40 mg by mouth daily.    Marland Kitchen omeprazole (PRILOSEC) 20 MG capsule Take 20 mg by mouth daily.    . simvastatin (ZOCOR) 40 MG tablet Take 20 mg by mouth daily at 6 PM. Reported on 01/02/2016     No current facility-administered medications for this visit.   Facility-Administered Medications Ordered in Other Visits  Medication Dose Route Frequency Provider Last Rate Last Admin  . gadopentetate dimeglumine (MAGNEVIST) injection 20 mL  20 mL Intravenous Once PRN Kathrynn Ducking, MD       Allergies  Allergen Reactions  . Penicillins Other (See Comments)    Has patient had a PCN  reaction causing immediate rash, facial/tongue/throat swelling, SOB or lightheadedness with hypotension: unknown Has patient had a PCN reaction causing severe rash involving mucus membranes or skin necrosis: unknown Has patient had a PCN reaction that required hospitalization unknown Has patient had a PCN reaction occurring within the last 10 years: No If all of the above answers are "NO", then may proceed with Cephalosporin use.      Review of Systems: All systems reviewed and negative except where noted in HPI.   No labs on file  Physical Exam: BP (!) 144/76   Pulse 55   Ht 6' (1.829 m)   Wt (!) 238 lb 9.6 oz (108.2 kg)   BMI 32.36 kg/m  Constitutional: Pleasant,well-developed, male in no acute distress. HEENT: Normocephalic and atraumatic. Conjunctivae are normal. No scleral icterus. Neck supple.  Cardiovascular: Normal rate, regular rhythm.  Pulmonary/chest: Effort normal and breath sounds normal.  Abdominal: Soft, nondistended, nontender.   Extremities: no edema Lymphadenopathy: No cervical adenopathy noted. Neurological: Alert and oriented to person place and time. Skin: Skin is warm and dry. No rashes noted. Psychiatric: Normal mood and affect. Behavior is normal.   ASSESSMENT AND PLAN: 69 year old male here for new patient assessment the following:  Colon cancer screening / difficult airway for intubation - the patient is overdue for routine colon cancer screening.  He has no concerning bowel symptoms.  We discussed options for colon cancer screening and recommend optical colonoscopy.  He has no cardiopulmonary symptoms, echo recently performed and looks okay, his cardiologist has cleared him to proceed with endoscopic evaluation.  Unfortunately given he has a difficult airway for intubation, he cannot have his exam performed at the [facility], and will need to proceed with his exam at the hospital.  Timing of this needs to be determined based off availability in our schedule.   We will call him back to schedule in the next month or 2.  Following discussion of risks and benefits of colonoscopy he wanted to proceed.  Further recommendations pending results.  GERD - longstanding symptoms of reflux, well controlled with omeprazole.  Given his age, duration of symptoms, ethnicity, gender, he meets criteria for screening for Barrett's esophagus.  I offered him an upper endoscopy to be done at the same time as his colonoscopy.  Following discussion of risks and benefits he strongly wanted to proceed with screening EGD.  Further recommendations pending the results.  Remo Lipps  Havery Moros, MD [facility]  CC: Eulogio Ditch, NP

## 2020-03-30 NOTE — Patient Instructions (Signed)
If you are age 68 or older, your body mass index should be between 23-30. Your Body mass index is 32.36 kg/m. If this is out of the aforementioned range listed, please consider follow up with your Primary Care Provider.  If you are age 27 or younger, your body mass index should be between 19-25. Your Body mass index is 32.36 kg/m. If this is out of the aformentioned range listed, please consider follow up with your Primary Care Provider.   We will call you to schedule you for a colonoscopy and EGD in October at [facility] when the schedule opens.  You will be scheduled for a pre-visit and procedure at that time.  Thank you for entrusting me with your care and for choosing [facility], Dr. Brown Deer Cellar

## 2020-05-10 ENCOUNTER — Other Ambulatory Visit: Payer: Self-pay

## 2020-05-10 DIAGNOSIS — Z1211 Encounter for screening for malignant neoplasm of colon: Secondary | ICD-10-CM

## 2020-05-10 DIAGNOSIS — K219 Gastro-esophageal reflux disease without esophagitis: Secondary | ICD-10-CM

## 2020-05-10 NOTE — Progress Notes (Signed)
EGD/Colon at [facility]

## 2020-05-11 ENCOUNTER — Telehealth: Payer: Self-pay

## 2020-05-11 NOTE — Telephone Encounter (Signed)
Called and spoke to pt.  He is available to have ECL at [facility] on 11-2 with Dr. Havery Moros. Pt understands he will be Covid tested on 10-29 and has a PV scheduled for 10-21 at 3:30pm. Pt lives in [place], [place].

## 2020-06-21 ENCOUNTER — Ambulatory Visit (AMBULATORY_SURGERY_CENTER): Payer: Self-pay

## 2020-06-21 ENCOUNTER — Other Ambulatory Visit: Payer: Self-pay

## 2020-06-21 VITALS — Ht 72.0 in | Wt 245.0 lb

## 2020-06-21 DIAGNOSIS — Z01818 Encounter for other preprocedural examination: Secondary | ICD-10-CM

## 2020-06-21 DIAGNOSIS — Z1211 Encounter for screening for malignant neoplasm of colon: Secondary | ICD-10-CM

## 2020-06-21 MED ORDER — SUTAB 1479-225-188 MG PO TABS: 1.0000 | ORAL_TABLET | ORAL | 0 refills | Status: DC

## 2020-06-21 NOTE — Progress Notes (Signed)
No egg or soy allergy known to patient  No issues with past sedation with any surgeries or procedures intubation problems in the past ----difficult intubation---procedure at [facility] No FH of Malignant Hyperthermia No diet pills per patient No home 02 use per patient  No blood thinners per patient  Pt denies issues with constipation  No A fib or A flutter  EMMI video via [place] 19 guidelines implemented in PV today with Pt and RN  Coupon given to pt in PV today , Code to Pharmacy  COVID screening scheduled on 06/29/2020 at 1:00 pm; patient is aware of appt date/time; Due to the COVID-19 pandemic we are asking patients to follow these guidelines. Please only bring one care partner. Please be aware that your care partner may wait in the car in the parking lot or if they feel like they will be too hot to wait in the car, they may wait in the lobby on the 4th floor. All care partners are required to wear a mask the entire time (we do not have any that we can provide them), they need to practice social distancing, and we will do a Covid check for all patient's and care partners when you arrive. Also we will check their temperature and your temperature. If the care partner waits in their car they need to stay in the parking lot the entire time and we will call them on their cell phone when the patient is ready for discharge so they can bring the car to the front of the building. Also all patient's will need to wear a mask into building.

## 2020-06-29 ENCOUNTER — Other Ambulatory Visit (HOSPITAL_COMMUNITY)
Admission: RE | Admit: 2020-06-29 | Discharge: 2020-06-29 | Disposition: A | Discharge status: Home / Self Care | Payer: Medicare Other | Source: Ambulatory Visit | Attending: Gastroenterology | Admitting: Gastroenterology

## 2020-06-29 DIAGNOSIS — Z20822 Contact with and (suspected) exposure to covid-19: Secondary | ICD-10-CM | POA: Insufficient documentation

## 2020-06-29 DIAGNOSIS — Z01812 Encounter for preprocedural laboratory examination: Secondary | ICD-10-CM | POA: Insufficient documentation

## 2020-06-30 LAB — SARS CORONAVIRUS 2 (TAT 6-24 HRS): SARS Coronavirus 2: NEGATIVE

## 2020-07-03 ENCOUNTER — Other Ambulatory Visit: Payer: Self-pay

## 2020-07-03 ENCOUNTER — Encounter (HOSPITAL_COMMUNITY): Payer: Self-pay | Admitting: Gastroenterology

## 2020-07-03 ENCOUNTER — Ambulatory Visit (HOSPITAL_COMMUNITY): Payer: Medicare Other

## 2020-07-03 ENCOUNTER — Ambulatory Visit (HOSPITAL_COMMUNITY)
Admission: RE | Admit: 2020-07-03 | Discharge: 2020-07-03 | Disposition: A | Discharge status: Home / Self Care | Payer: Medicare Other | Attending: Gastroenterology | Admitting: Gastroenterology

## 2020-07-03 ENCOUNTER — Encounter (HOSPITAL_COMMUNITY)
Admission: RE | Disposition: A | Discharge status: Home / Self Care | Payer: Self-pay | Source: Home / Self Care | Attending: Gastroenterology

## 2020-07-03 DIAGNOSIS — K2970 Gastritis, unspecified, without bleeding: Secondary | ICD-10-CM | POA: Diagnosis not present

## 2020-07-03 DIAGNOSIS — D123 Benign neoplasm of transverse colon: Secondary | ICD-10-CM | POA: Diagnosis not present

## 2020-07-03 DIAGNOSIS — K3189 Other diseases of stomach and duodenum: Secondary | ICD-10-CM | POA: Diagnosis not present

## 2020-07-03 DIAGNOSIS — Z8379 Family history of other diseases of the digestive system: Secondary | ICD-10-CM | POA: Insufficient documentation

## 2020-07-03 DIAGNOSIS — Z880 Allergy status to penicillin: Secondary | ICD-10-CM | POA: Insufficient documentation

## 2020-07-03 DIAGNOSIS — Z87891 Personal history of nicotine dependence: Secondary | ICD-10-CM | POA: Insufficient documentation

## 2020-07-03 DIAGNOSIS — K5730 Diverticulosis of large intestine without perforation or abscess without bleeding: Secondary | ICD-10-CM | POA: Diagnosis not present

## 2020-07-03 DIAGNOSIS — Z791 Long term (current) use of non-steroidal anti-inflammatories (NSAID): Secondary | ICD-10-CM | POA: Diagnosis not present

## 2020-07-03 DIAGNOSIS — Z79899 Other long term (current) drug therapy: Secondary | ICD-10-CM | POA: Insufficient documentation

## 2020-07-03 DIAGNOSIS — K2289 Other specified disease of esophagus: Secondary | ICD-10-CM | POA: Diagnosis not present

## 2020-07-03 DIAGNOSIS — D124 Benign neoplasm of descending colon: Secondary | ICD-10-CM | POA: Diagnosis not present

## 2020-07-03 DIAGNOSIS — K319 Disease of stomach and duodenum, unspecified: Secondary | ICD-10-CM | POA: Diagnosis not present

## 2020-07-03 DIAGNOSIS — D126 Benign neoplasm of colon, unspecified: Secondary | ICD-10-CM

## 2020-07-03 DIAGNOSIS — K6289 Other specified diseases of anus and rectum: Secondary | ICD-10-CM | POA: Insufficient documentation

## 2020-07-03 DIAGNOSIS — K449 Diaphragmatic hernia without obstruction or gangrene: Secondary | ICD-10-CM | POA: Diagnosis not present

## 2020-07-03 DIAGNOSIS — K219 Gastro-esophageal reflux disease without esophagitis: Secondary | ICD-10-CM | POA: Diagnosis not present

## 2020-07-03 DIAGNOSIS — Z1211 Encounter for screening for malignant neoplasm of colon: Secondary | ICD-10-CM

## 2020-07-03 DIAGNOSIS — D122 Benign neoplasm of ascending colon: Secondary | ICD-10-CM | POA: Insufficient documentation

## 2020-07-03 DIAGNOSIS — Z13810 Encounter for screening for upper gastrointestinal disorder: Secondary | ICD-10-CM | POA: Diagnosis not present

## 2020-07-03 DIAGNOSIS — K259 Gastric ulcer, unspecified as acute or chronic, without hemorrhage or perforation: Secondary | ICD-10-CM | POA: Diagnosis not present

## 2020-07-03 DIAGNOSIS — Z7982 Long term (current) use of aspirin: Secondary | ICD-10-CM | POA: Diagnosis not present

## 2020-07-03 HISTORY — PX: POLYPECTOMY: SHX5525

## 2020-07-03 HISTORY — PX: BIOPSY: SHX5522

## 2020-07-03 HISTORY — PX: ESOPHAGOGASTRODUODENOSCOPY (EGD) WITH PROPOFOL: SHX5813

## 2020-07-03 HISTORY — PX: COLONOSCOPY WITH PROPOFOL: SHX5780

## 2020-07-03 SURGERY — COLONOSCOPY WITH PROPOFOL
Anesthesia: Monitor Anesthesia Care

## 2020-07-03 MED ORDER — OMEPRAZOLE 40 MG PO CPDR: 40.0000 mg | DELAYED_RELEASE_CAPSULE | Freq: Two times a day (BID) | ORAL | 1 refills | Status: DC

## 2020-07-03 MED ORDER — PROPOFOL 500 MG/50ML IV EMUL
INTRAVENOUS | Status: DC | PRN
  Administered 2020-07-03: 125 ug/kg/min via INTRAVENOUS

## 2020-07-03 MED ORDER — SODIUM CHLORIDE 0.9 % IV SOLN: INTRAVENOUS | Status: DC

## 2020-07-03 MED ORDER — PHENYLEPHRINE 40 MCG/ML (10ML) SYRINGE FOR IV PUSH (FOR BLOOD PRESSURE SUPPORT)
PREFILLED_SYRINGE | INTRAVENOUS | Status: DC | PRN
  Administered 2020-07-03 (×4): 80 ug via INTRAVENOUS

## 2020-07-03 MED ORDER — LACTATED RINGERS IV SOLN: INTRAVENOUS | Status: DC

## 2020-07-03 MED ORDER — LIDOCAINE HCL (CARDIAC) PF 100 MG/5ML IV SOSY
PREFILLED_SYRINGE | INTRAVENOUS | Status: DC | PRN
  Administered 2020-07-03: 100 mg via INTRAVENOUS

## 2020-07-03 MED ORDER — EPHEDRINE SULFATE-NACL 50-0.9 MG/10ML-% IV SOSY
PREFILLED_SYRINGE | INTRAVENOUS | Status: DC | PRN
  Administered 2020-07-03: 10 mg via INTRAVENOUS

## 2020-07-03 MED ORDER — PROPOFOL 10 MG/ML IV BOLUS
INTRAVENOUS | Status: DC | PRN
  Administered 2020-07-03 (×3): 30 mg via INTRAVENOUS
  Administered 2020-07-03: 10 mg via INTRAVENOUS

## 2020-07-03 MED ORDER — GLYCOPYRROLATE 0.2 MG/ML IJ SOLN
INTRAMUSCULAR | Status: DC | PRN
  Administered 2020-07-03: .2 mg via INTRAVENOUS

## 2020-07-03 SURGICAL SUPPLY — 24 items

## 2020-07-03 NOTE — Op Note (Signed)
[facility] Patient Name: Harold Barajas Procedure Date: 07/03/2020 MRN: [id number] Attending MD: Carlota Raspberry. Havery Moros , MD Date of Birth: [birth year] CSN: [id number] Age: 69 Admit Type: Outpatient Procedure:                Colonoscopy Indications:              Screening for colorectal malignant neoplasm Providers:                Remo Lipps P. Havery Moros, MD, Josie Dixon, RN,                            Particia Nearing, RN, Laverda Sorenson, Technician,                            Nicholes Calamity, CRNA Referring MD:              Medicines:                Monitored Anesthesia Care Complications:            No immediate complications. Estimated blood loss:                            Minimal. Estimated Blood Loss:     Estimated blood loss was minimal. Procedure:                Pre-Anesthesia Assessment:                           - Prior to the procedure, a History and Physical                            was performed, and patient medications and                            allergies were reviewed. The patient's tolerance of                            previous anesthesia was also reviewed. The risks                            and benefits of the procedure and the sedation                            options and risks were discussed with the patient.                            All questions were answered, and informed consent                            was obtained. Prior Anticoagulants: The patient has                            taken no previous anticoagulant or antiplatelet                            agents. ASA Grade  Assessment: III - A patient with                            severe systemic disease. After reviewing the risks                            and benefits, the patient was deemed in                            satisfactory condition to undergo the procedure.                           After obtaining informed consent, the colonoscope                            was passed  under direct vision. Throughout the                            procedure, the patient's blood pressure, pulse, and                            oxygen saturations were monitored continuously. The                            CF-HQ190L ([id number] Olympus colonoscope was                            introduced through the anus and advanced to the the                            cecum, identified by appendiceal orifice and                            ileocecal valve. The colonoscopy was performed                            without difficulty. The patient tolerated the                            procedure well. The quality of the bowel                            preparation was good. The ileocecal valve,                            appendiceal orifice, and rectum were photographed. Scope In: 9:22:43 AM Scope Out: 9:45:38 AM Scope Withdrawal Time: 0 hours 19 minutes 8 seconds  Total Procedure Duration: 0 hours 22 minutes 55 seconds  Findings:      The perianal and digital rectal examinations were normal.      Three sessile polyps were found in the ascending colon. The polyps were       3 to 5 mm in size. These polyps were removed with a cold snare.       Resection and retrieval were complete.  Two sessile polyps were found in the transverse colon. The polyps were 3       to 5 mm in size. These polyps were removed with a cold snare. Resection       and retrieval were complete.      A 5 mm polyp was found in the splenic flexure. The polyp was sessile.       The polyp was removed with a cold snare. Resection and retrieval were       complete.      A 3 mm polyp was found in the descending colon. The polyp was sessile.       The polyp was removed with a cold snare. Resection and retrieval were       complete.      A few small-mouthed diverticula were found in the sigmoid colon.      Anal papilla(e) were hypertrophied.      The exam was otherwise without abnormality. Impression:               - Three  3 to 5 mm polyps in the ascending colon,                            removed with a cold snare. Resected and retrieved.                           - Two 3 to 5 mm polyps in the transverse colon,                            removed with a cold snare. Resected and retrieved.                           - One 5 mm polyp at the splenic flexure, removed                            with a cold snare. Resected and retrieved.                           - One 3 mm polyp in the descending colon, removed                            with a cold snare. Resected and retrieved.                           - Diverticulosis in the sigmoid colon.                           - Anal papilla(e) were hypertrophied.                           - The examination was otherwise normal. Moderate Sedation:      No moderate sedation, case performed with MAC Recommendation:           - Patient has a contact number available for                            emergencies. The signs and symptoms  of potential                            delayed complications were discussed with the                            patient. Return to normal activities tomorrow.                            Written discharge instructions were provided to the                            patient.                           - Resume previous diet.                           - Continue present medications.                           - Await pathology results. Procedure Code(s):        --- Professional ---                           [phone], Colonoscopy, flexible; with removal of                            tumor(s), polyp(s), or other lesion(s) by snare                            technique Diagnosis Code(s):        --- Professional ---                           K63.5, Polyp of colon                           Z12.11, Encounter for screening for malignant                            neoplasm of colon                           K62.89, Other specified diseases of anus and rectum                            K57.30, Diverticulosis of large intestine without                            perforation or abscess without bleeding CPT copyright 2019 American Medical Association. All rights reserved. The codes documented in this report are preliminary and upon coder review may  be revised to meet current compliance requirements. Remo Lipps P. Erianna Jolly, MD 07/03/2020 9:53:14 AM This report has been signed electronically. Number of Addenda: 0

## 2020-07-03 NOTE — Transfer of Care (Signed)
Immediate Anesthesia Transfer of Care Note  Patient: Harold Barajas.  Procedure(s) Performed: COLONOSCOPY WITH PROPOFOL (N/A ) ESOPHAGOGASTRODUODENOSCOPY (EGD) WITH PROPOFOL (N/A ) BIOPSY POLYPECTOMY  Patient Location: PACU  Anesthesia Type:MAC  Level of Consciousness: drowsy  Airway & Oxygen Therapy: Patient Spontanous Breathing and Patient connected to face mask oxygen  Post-op Assessment: Report given to RN and Post -op Vital signs reviewed and stable  Post vital signs: Reviewed and stable  Last Vitals:  Vitals Value Taken Time  BP    Temp    Pulse    Resp    SpO2      Last Pain:  Vitals:   07/03/20 0827  TempSrc: Oral  PainSc: 0-No pain         Complications: No complications documented.

## 2020-07-03 NOTE — Anesthesia Preprocedure Evaluation (Addendum)
Anesthesia Evaluation  Patient identified by MRN, date of birth, ID band Patient awake    Reviewed: Allergy & Precautions, NPO status , Patient's Chart, lab work & pertinent test results  Airway Mallampati: III  TM Distance: <3 FB Neck ROM: Full    Dental  (+) Teeth Intact, Dental Advisory Given   Pulmonary sleep apnea and Continuous Positive Airway Pressure Ventilation , former smoker,    breath sounds clear to auscultation       Cardiovascular hypertension, Pt. on home beta blockers and Pt. on medications + CAD   Rhythm:Regular Rate:Normal  - LVEF 55%   Neuro/Psych negative psych ROS   GI/Hepatic Neg liver ROS, GERD  Medicated and Controlled,  Endo/Other  negative endocrine ROS  Renal/GU negative Renal ROS     Musculoskeletal  (+) Arthritis ,   Abdominal Normal abdominal exam  (+)   Peds  Hematology negative hematology ROS (+)   Anesthesia Other Findings   Reproductive/Obstetrics                           Anesthesia Physical Anesthesia Plan  ASA: III  Anesthesia Plan: MAC   Post-op Pain Management:    Induction: Intravenous  PONV Risk Score and Plan: 0 and Propofol infusion  Airway Management Planned: Natural Airway, Simple Face Mask and Nasal Cannula  Additional Equipment: None  Intra-op Plan:   Post-operative Plan:   Informed Consent: I have reviewed the patients History and Physical, chart, labs and discussed the procedure including the risks, benefits and alternatives for the proposed anesthesia with the patient or authorized representative who has indicated his/her understanding and acceptance.       Plan Discussed with: CRNA  Anesthesia Plan Comments:        Anesthesia Quick Evaluation

## 2020-07-03 NOTE — Op Note (Addendum)
[facility] Patient Name: Harold Barajas Procedure Date: 07/03/2020 MRN: [id number] Attending MD: Carlota Raspberry. Havery Moros , MD Date of Birth: [birth year] CSN: [id number] Age: 69 Admit Type: Outpatient Procedure:                Upper GI endoscopy Indications:              Screening for Barrett's esophagus - longstanding                            reflux on omeprazole 9m / day Providers:                SCarlota Raspberry AHavery Moros MD, LParticia Nearing RN, MJosie Dixon RN, DLaverda Sorenson Technician, SNicholes Calamity CRNA Referring MD:              Medicines:                Monitored Anesthesia Care Complications:            No immediate complications. Estimated blood loss:                            Minimal. Estimated Blood Loss:     Estimated blood loss was minimal. Procedure:                Pre-Anesthesia Assessment:                           - Prior to the procedure, a History and Physical                            was performed, and patient medications and                            allergies were reviewed. The patient's tolerance of                            previous anesthesia was also reviewed. The risks                            and benefits of the procedure and the sedation                            options and risks were discussed with the patient.                            All questions were answered, and informed consent                            was obtained. Prior Anticoagulants: The patient has  taken no previous anticoagulant or antiplatelet                            agents. ASA Grade Assessment: III - A patient with                            severe systemic disease. After reviewing the risks                            and benefits, the patient was deemed in                            satisfactory condition to undergo the procedure.                           After obtaining  informed consent, the endoscope was                            passed under direct vision. Throughout the                            procedure, the patient's blood pressure, pulse, and                            oxygen saturations were monitored continuously. The                            GIF-H190 ([id number]) Olympus gastroscope was                            introduced through the mouth, and advanced to the                            second part of duodenum. The upper GI endoscopy was                            accomplished without difficulty. The patient                            tolerated the procedure well. Scope In: Scope Out: Findings:      Esophagogastric landmarks were identified: the Z-line was found at 38       cm, the gastroesophageal junction was found at 38 cm and the upper       extent of the gastric folds was found at 41 cm from the incisors.      A 3 cm hiatal hernia was present.      The Z-line was irregular and was found 38 cm from the incisors, with a       focal area of salmon colored mucosa roughly 6-90m extending up from the       GEJ without nodularity. Biopsies were taken with a cold forceps for       histology.      The exam of the esophagus was otherwise normal.      Few non-bleeding superficial large gastric ulcers with no stigmata  of       bleeding were found in the gastric antrum with significant erythema,       taking up about 25% of the circumference of the antrum. Biopsies were       taken with a cold forceps for histology.      Patchy mild inflammation characterized by erythema was found in the       gastric fundus.      The exam of the stomach was otherwise normal. A few small benign       appearing gastric polyps were noted, likely fundic gland.      Biopsies were taken with a cold forceps in the gastric body, at the       incisura and in the gastric antrum for Helicobacter pylori testing.      Localized erythematous mucosa was found in the second  portion of the       duodenum, without ulceration. Biopsies were taken with a cold forceps       for histology.      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 The exam of the       duodenum was otherwise normal. Impression:               - Esophagogastric landmarks identified.                           - 3 cm hiatal hernia.                           - Z-line irregular, 38 cm from the incisors.                            Biopsied.                           - Non-bleeding large flat superficial gastric                            ulcers with no stigmata of bleeding. Biopsied.                           - Gastritis. Biopsied                           - Normal stomach otherwise.                           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                           - Erythematous duodenopathy. Biopsied.                           Will await biopsies for H pylori, but unclear if                            NSAID use is causing ulcers? Will discuss with  patient, would avoid NSAIDs given findings on this                            exam. Moderate Sedation:      No moderate sedation, case performed with MAC Recommendation:           - Patient has a contact number available for                            emergencies. The signs and symptoms of potential                            delayed complications were discussed with the                            patient. Return to normal activities tomorrow.                            Written discharge instructions were provided to the                            patient.                           - Resume previous diet.                           - Continue present medications.                           - Increase omeprazole to 71m BID for 4 weeks, and                            then down to 416m/ day                           - Await pathology results.                           - Repeat an EGD in a few months  to assess for                            mucosal healing Procedure Code(s):        --- Professional ---                           43402 [id number]scopy, flexible,                            transoral; with biopsy, single or multiple Diagnosis Code(s):        --- Professional ---                           K44.9, Diaphragmatic hernia without obstruction or  gangrene                           K22.8, Other specified diseases of esophagus                           K25.9, Gastric ulcer, unspecified as acute or                            chronic, without hemorrhage or perforation                           K29.70, Gastritis, unspecified, without bleeding                           K31.89, Other diseases of stomach and duodenum                           Z13.810, Encounter for screening for upper                            gastrointestinal disorder CPT copyright 2019 American Medical Association. All rights reserved. The codes documented in this report are preliminary and upon coder review may  be revised to meet current compliance requirements. Remo Lipps P. Pinchos Topel, MD 07/03/2020 10:06:03 AM This report has been signed electronically. Number of Addenda: 0

## 2020-07-03 NOTE — H&P (Signed)
HPI:   Harold Barajas. is a 69 y.o. male with a history of a reported difficult airway per anesthesia, at the hospital today for anesthesia assistance for screening colonoscopy and EGD to screen for Barrett's in light of his longstanding reflux. No interval changes since I have last seen him. He is feeling well today without complaints.   Past Medical History:  Diagnosis Date  . Allergy    seasonal allergies  . Angina at rest [facility])   . Arthritis    on meds  . Borderline diabetes   . Coronary artery disease    "leaky valve"  . 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 Diplopia 01/02/2016  . GERD (gastroesophageal reflux disease)    on meds  . Heart murmur    child  . High cholesterol    on meds  . History of rheumatic fever    at age 93-13  . Hypertension    on meds  . OSA treated with BiPAP    cpap  . Pre-diabetes    been on prednisone  . Sleep apnea    uses CPAP  . 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 COLONOSCOPY  2006   [place]  . LUMBAR LAMINECTOMY/DECOMPRESSION MICRODISCECTOMY N/A 01/20/2017   Procedure: Lumbar two to Sacral one Laminectomy for Decompression;  Surgeon: Ditty, Kevan Ny, MD;  Location: [place];  Service: Neurosurgery;  Laterality: N/A;    Family History  Problem Relation Age of Onset  . Stroke Mother   . Heart attack Mother   . Aneurysm Father 25  . Crohn's disease Father   . Other Father        Crohn's dx (dx age 62)  . Diabetes Brother   . Heart attack Brother   . Leukemia Maternal Grandmother   . Colon cancer Neg Hx   . Rectal cancer Neg Hx   . Esophageal cancer Neg Hx   . Colon polyps Neg Hx   . Stomach cancer Neg Hx      Social History   Tobacco Use  . Smoking status: Former Smoker    Packs/day: 1.00    Years: 20.00    Pack years: 20.00    Quit date: 01/08/1980    Years since quitting: 40.5  . Smokeless tobacco: Never Used  . Tobacco comment: Quit 1982  Vaping  Use  . Vaping Use: Never used  Substance Use Topics  . Alcohol use: Yes    Alcohol/week: 8.0 standard drinks    Types: 8 Glasses of wine per week  . Drug use: No    Prior to Admission medications   Medication Sig Start Date End Date Taking? Authorizing Provider  acetaminophen (TYLENOL) 500 MG tablet Take 500 mg by mouth every 6 (six) hours as needed (for pain.).   Yes [provider]  aspirin EC 81 MG tablet Take 81 mg by mouth every evening. Swallow whole.   Yes [provider]  carvedilol (COREG) 25 MG tablet Take 25 mg by mouth 2 (two) times daily. 01/17/20  Yes [provider]  cetirizine (ZYRTEC) 10 MG tablet Take 10 mg by mouth daily. 12/13/15  Yes [provider]  Lidocaine-Menthol (LIDOCAINE PLUS MENTHOL EX) Apply 1 application topically 4 (four) times daily as needed (pain.). Lidocaine Plus Roll on   Yes [provider]  meloxicam (MOBIC) 7.5 MG tablet Take 7.5  mg by mouth in the morning and at bedtime.  11/30/16  Yes [provider]  olmesartan (BENICAR) 40 MG tablet Take 40 mg by mouth every evening.  02/15/20  Yes [provider]  omeprazole (PRILOSEC) 20 MG capsule Take 20 mg by mouth daily before breakfast.    Yes [provider]  simvastatin (ZOCOR) 20 MG tablet Take 20 mg by mouth at bedtime. 06/23/20  Yes [provider]  Sodium Sulfate-Mag Sulfate-KCl (SUTAB) [phone] MG TABS Take 1 kit by mouth as directed. MANUFACTURER CODES!! BIN: [id number] PCN: CN GROUP: [id number] MEMBER ID: [id number];EAK AS CASH;NO PRIOR AUTHORIZATION 06/21/20   Siddhi Dornbush, Carlota Raspberry, MD    Current Facility-Administered Medications  Medication Dose Route Frequency Provider Last Rate Last Admin  . 0.9 %  sodium chloride infusion   Intravenous Continuous Denis Carreon, Carlota Raspberry, MD       Facility-Administered Medications Ordered in Other Encounters  Medication Dose Route Frequency Provider Last Rate Last Admin  . gadopentetate  dimeglumine (MAGNEVIST) injection 20 mL  20 mL Intravenous Once PRN Kathrynn Ducking, MD        Allergies as of 05/11/2020 - Review Complete 03/30/2020  Allergen Reaction Noted  . Penicillins Other (See Comments) 01/02/2016     Review of Systems:    As per HPI, otherwise negative    Physical Exam:  Vital signs in last 24 hours:     General:   Pleasant male in NAD Lungs:  Respirations even and unlabored. Lungs clear to auscultation bilaterally.    Heart:  Regular rate and rhythm; no MRG Abdomen:  Soft, nondistended, nontender.  Extremities:  Without edema. Neurologic:  Alert and  oriented x4;  grossly normal neurologically. Skin:  Intact without significant lesions or rashes. Psych:  Alert and cooperative. Normal affect.    Impression / Plan:   69 y/o male here at the hospital for anesthesia assistance for EGD and colonoscopy given his history of having a difficult airway reported. EGD to be done for Barrett's screening, and colonoscopy for CRC screening. He denies complaints today. I have discussed risks / benefits of EGD and colonoscopy with him as well as risks of anesthesia, he wishes to proceed. Further recommendations pending the results.   Waskom Cellar, MD [facility]

## 2020-07-03 NOTE — Anesthesia Postprocedure Evaluation (Signed)
Anesthesia Post Note  Patient: Dakarai Mcglocklin.  Procedure(s) Performed: COLONOSCOPY WITH PROPOFOL (N/A ) ESOPHAGOGASTRODUODENOSCOPY (EGD) WITH PROPOFOL (N/A ) BIOPSY POLYPECTOMY     Patient location during evaluation: PACU Anesthesia Type: MAC Level of consciousness: awake and alert Pain management: pain level controlled Vital Signs Assessment: post-procedure vital signs reviewed and stable Respiratory status: spontaneous breathing, nonlabored ventilation and respiratory function stable Cardiovascular status: blood pressure returned to baseline and stable Postop Assessment: no apparent nausea or vomiting Anesthetic complications: no   No complications documented.  Last Vitals:  Vitals:   07/03/20 1000 07/03/20 1010  BP: (!) 121/56 (!) 190/94  Pulse: 87 83  Resp: (!) 22 (!) 23  Temp:    SpO2: 96% 97%    Last Pain:  Vitals:   07/03/20 1010  TempSrc:   PainSc: 0-No pain                 Pervis Hocking

## 2020-07-03 NOTE — Discharge Instructions (Signed)
YOU HAD AN ENDOSCOPIC PROCEDURE TODAY: Refer to the procedure report and other information in the discharge instructions given to you for any specific questions about what was found during the examination. If this information does not answer your questions, please call [place] office at [phone] to clarify.  ° °YOU SHOULD EXPECT: Some feelings of bloating in the abdomen. Passage of more gas than usual. Walking can help get rid of the air that was put into your GI tract during the procedure and reduce the bloating. If you had a lower endoscopy (such as a colonoscopy or flexible sigmoidoscopy) you may notice spotting of blood in your stool or on the toilet paper. Some abdominal soreness may be present for a day or two, also. ° °DIET: Your first meal following the procedure should be a light meal and then it is ok to progress to your normal diet. A half-sandwich or bowl of soup is an example of a good first meal. Heavy or fried foods are harder to digest and may make you feel nauseous or bloated. Drink plenty of fluids but you should avoid alcoholic beverages for 24 hours. If you had a esophageal dilation, please see attached instructions for diet.   ° °ACTIVITY: Your care partner should take you home directly after the procedure. You should plan to take it easy, moving slowly for the rest of the day. You can resume normal activity the day after the procedure however YOU SHOULD NOT DRIVE, use power tools, machinery or perform tasks that involve climbing or major physical exertion for 24 hours (because of the sedation medicines used during the test).  ° °SYMPTOMS TO REPORT IMMEDIATELY: °A gastroenterologist can be reached at any hour. Please call [phone]  for any of the following symptoms:  °Following lower endoscopy (colonoscopy, flexible sigmoidoscopy) °Excessive amounts of blood in the stool  °Significant tenderness, worsening of abdominal pains  °Swelling of the abdomen that is new, acute  °Fever of 100° or  higher  °Following upper endoscopy (EGD, EUS, ERCP, esophageal dilation) °Vomiting of blood or coffee ground material  °New, significant abdominal pain  °New, significant chest pain or pain under the shoulder blades  °Painful or persistently difficult swallowing  °New shortness of breath  °Black, tarry-looking or red, bloody stools ° °FOLLOW UP:  °If any biopsies were taken you will be contacted by phone or by letter within the next 1-3 weeks. Call [phone]  if you have not heard about the biopsies in 3 weeks.  °Please also call with any specific questions about appointments or follow up tests. ° °

## 2020-07-03 NOTE — Interval H&P Note (Signed)
History and Physical Interval Note:  07/03/2020 8:20 AM  Harold Barajas.  has presented today for surgery, with the diagnosis of colon cancer screening, GERD.  The various methods of treatment have been discussed with the patient and family. After consideration of risks, benefits and other options for treatment, the patient has consented to  Procedure(s): COLONOSCOPY WITH PROPOFOL (N/A) ESOPHAGOGASTRODUODENOSCOPY (EGD) WITH PROPOFOL (N/A) as a surgical intervention.  The patient's history has been reviewed, patient examined, no change in status, stable for surgery.  I have reviewed the patient's chart and labs.  Questions were answered to the patient's satisfaction.     [place]

## 2020-07-04 ENCOUNTER — Other Ambulatory Visit: Payer: Self-pay

## 2020-07-04 ENCOUNTER — Encounter (HOSPITAL_COMMUNITY): Payer: Self-pay | Admitting: Gastroenterology

## 2020-07-04 LAB — SURGICAL PATHOLOGY

## 2020-09-18 ENCOUNTER — Telehealth: Payer: Self-pay

## 2020-09-18 NOTE — Telephone Encounter (Signed)
Left message for patient: Is he available to have EGD with [facility] at [facility] on 3-10? He would need to be Covid tested on Monday 3-7.

## 2020-09-19 ENCOUNTER — Other Ambulatory Visit: Payer: Self-pay

## 2020-09-19 DIAGNOSIS — T884XXS Failed or difficult intubation, sequela: Secondary | ICD-10-CM

## 2020-09-19 DIAGNOSIS — K259 Gastric ulcer, unspecified as acute or chronic, without hemorrhage or perforation: Secondary | ICD-10-CM

## 2020-09-19 DIAGNOSIS — K219 Gastro-esophageal reflux disease without esophagitis: Secondary | ICD-10-CM

## 2020-09-19 NOTE — Progress Notes (Signed)
Patient scheduled with Dr. Havery Moros at [facility] for EGD.

## 2020-09-19 NOTE — Telephone Encounter (Signed)
Patient called back. He can not do 3-10 but he can do 3-15 and be Covid tested on 3-11.  Scheduled patient for EGD at [facility] on Tuesday, 3-15 at 7:30am and Covid test on Friday, 3-11. Previsit scheduled 3-1 at 1:30pm

## 2020-10-15 ENCOUNTER — Other Ambulatory Visit: Payer: Self-pay | Admitting: Gastroenterology

## 2020-10-30 ENCOUNTER — Ambulatory Visit (AMBULATORY_SURGERY_CENTER): Payer: Self-pay | Admitting: *Deleted

## 2020-10-30 ENCOUNTER — Other Ambulatory Visit: Payer: Self-pay

## 2020-10-30 VITALS — Ht 72.0 in | Wt 245.0 lb

## 2020-10-30 DIAGNOSIS — K259 Gastric ulcer, unspecified as acute or chronic, without hemorrhage or perforation: Secondary | ICD-10-CM

## 2020-10-30 DIAGNOSIS — K219 Gastro-esophageal reflux disease without esophagitis: Secondary | ICD-10-CM

## 2020-10-30 NOTE — Progress Notes (Signed)
No egg or soy allergy known to patient  No issues with past sedation with any surgeries or procedures No intubation problems in the past  No FH of Malignant Hyperthermia No diet pills per patient No home 02 use per patient  No blood thinners per patient  Pt denies issues with constipation  No A fib or A flutter  EMMI video to pt or via [place] 19 guidelines implemented in [place] today with Pt and RN  Pt is fully vaccinated  for Covid  Pt denies loose or missing teeth, denies dentures, partials, dental implants, capped or bonded teeth    Due to the COVID-19 pandemic we are asking patients to follow certain guidelines.  Pt aware of COVID protocols and [facility] guidelines

## 2020-11-06 NOTE — Progress Notes (Signed)
Attempted to obtain medical history via telephone, unable to reach at this time. I left a voicemail to return pre surgical testing department's phone call.  

## 2020-11-09 ENCOUNTER — Encounter (HOSPITAL_COMMUNITY): Payer: Self-pay | Admitting: Gastroenterology

## 2020-11-09 ENCOUNTER — Other Ambulatory Visit: Payer: Self-pay

## 2020-11-09 ENCOUNTER — Other Ambulatory Visit (HOSPITAL_COMMUNITY)
Admission: RE | Admit: 2020-11-09 | Discharge: 2020-11-09 | Disposition: A | Discharge status: Home / Self Care | Payer: Medicare Other | Source: Ambulatory Visit | Attending: Gastroenterology | Admitting: Gastroenterology

## 2020-11-09 DIAGNOSIS — Z20822 Contact with and (suspected) exposure to covid-19: Secondary | ICD-10-CM | POA: Insufficient documentation

## 2020-11-09 DIAGNOSIS — Z01812 Encounter for preprocedural laboratory examination: Secondary | ICD-10-CM | POA: Insufficient documentation

## 2020-11-10 LAB — SARS CORONAVIRUS 2 (TAT 6-24 HRS): SARS Coronavirus 2: NEGATIVE

## 2020-11-12 NOTE — Anesthesia Preprocedure Evaluation (Addendum)
Anesthesia Evaluation  Patient identified by MRN, date of birth, ID band Patient awake    Reviewed: Allergy & Precautions, H&P , NPO status , Patient's Chart, lab work & pertinent test results  History of Anesthesia Complications (+) DIFFICULT AIRWAY  Airway Mallampati: III  TM Distance: >3 FB Neck ROM: Full    Dental no notable dental hx. (+) Teeth Intact, Dental Advisory Given   Pulmonary sleep apnea and Continuous Positive Airway Pressure Ventilation , former smoker,    Pulmonary exam normal breath sounds clear to auscultation       Cardiovascular Exercise Tolerance: Good hypertension, Pt. on medications and Pt. on home beta blockers  Rhythm:Regular Rate:Normal     Neuro/Psych negative neurological ROS  negative psych ROS   GI/Hepatic Neg liver ROS, PUD, GERD  Medicated,  Endo/Other  negative endocrine ROS  Renal/GU negative Renal ROS  negative genitourinary   Musculoskeletal  (+) Arthritis , Osteoarthritis,    Abdominal   Peds  Hematology negative hematology ROS (+)   Anesthesia Other Findings   Reproductive/Obstetrics negative OB ROS                            Anesthesia Physical Anesthesia Plan  ASA: III  Anesthesia Plan: MAC   Post-op Pain Management:    Induction: Intravenous  PONV Risk Score and Plan: 1 and Propofol infusion and Treatment may vary due to age or medical condition  Airway Management Planned: Simple Face Mask  Additional Equipment:   Intra-op Plan:   Post-operative Plan:   Informed Consent: I have reviewed the patients History and Physical, chart, labs and discussed the procedure including the risks, benefits and alternatives for the proposed anesthesia with the patient or authorized representative who has indicated his/her understanding and acceptance.     Dental advisory given  Plan Discussed with: CRNA  Anesthesia Plan Comments:         Anesthesia Quick Evaluation

## 2020-11-13 ENCOUNTER — Encounter (HOSPITAL_COMMUNITY): Payer: Self-pay | Admitting: Gastroenterology

## 2020-11-13 ENCOUNTER — Ambulatory Visit (HOSPITAL_COMMUNITY): Payer: Medicare Other | Admitting: Anesthesiology

## 2020-11-13 ENCOUNTER — Ambulatory Visit (HOSPITAL_COMMUNITY)
Admission: RE | Admit: 2020-11-13 | Discharge: 2020-11-13 | Disposition: A | Discharge status: Home / Self Care | Payer: Medicare Other | Attending: Gastroenterology | Admitting: Gastroenterology

## 2020-11-13 ENCOUNTER — Other Ambulatory Visit: Payer: Self-pay

## 2020-11-13 ENCOUNTER — Encounter (HOSPITAL_COMMUNITY)
Admission: RE | Disposition: A | Discharge status: Home / Self Care | Payer: Self-pay | Source: Home / Self Care | Attending: Gastroenterology

## 2020-11-13 DIAGNOSIS — K2289 Other specified disease of esophagus: Secondary | ICD-10-CM | POA: Diagnosis not present

## 2020-11-13 DIAGNOSIS — Z09 Encounter for follow-up examination after completed treatment for conditions other than malignant neoplasm: Secondary | ICD-10-CM | POA: Insufficient documentation

## 2020-11-13 DIAGNOSIS — K219 Gastro-esophageal reflux disease without esophagitis: Secondary | ICD-10-CM

## 2020-11-13 DIAGNOSIS — K259 Gastric ulcer, unspecified as acute or chronic, without hemorrhage or perforation: Secondary | ICD-10-CM

## 2020-11-13 DIAGNOSIS — Z880 Allergy status to penicillin: Secondary | ICD-10-CM | POA: Insufficient documentation

## 2020-11-13 DIAGNOSIS — Z833 Family history of diabetes mellitus: Secondary | ICD-10-CM | POA: Insufficient documentation

## 2020-11-13 DIAGNOSIS — Z8379 Family history of other diseases of the digestive system: Secondary | ICD-10-CM | POA: Insufficient documentation

## 2020-11-13 DIAGNOSIS — Z87891 Personal history of nicotine dependence: Secondary | ICD-10-CM | POA: Diagnosis not present

## 2020-11-13 DIAGNOSIS — Z8719 Personal history of other diseases of the digestive system: Secondary | ICD-10-CM | POA: Diagnosis not present

## 2020-11-13 DIAGNOSIS — K449 Diaphragmatic hernia without obstruction or gangrene: Secondary | ICD-10-CM | POA: Insufficient documentation

## 2020-11-13 DIAGNOSIS — T884XXS Failed or difficult intubation, sequela: Secondary | ICD-10-CM

## 2020-11-13 HISTORY — PX: ESOPHAGOGASTRODUODENOSCOPY (EGD) WITH PROPOFOL: SHX5813

## 2020-11-13 SURGERY — ESOPHAGOGASTRODUODENOSCOPY (EGD) WITH PROPOFOL
Anesthesia: Monitor Anesthesia Care

## 2020-11-13 MED ORDER — PROPOFOL 10 MG/ML IV BOLUS
INTRAVENOUS | Status: DC | PRN
  Administered 2020-11-13 (×2): 20 mg via INTRAVENOUS
  Administered 2020-11-13: 50 mg via INTRAVENOUS
  Administered 2020-11-13 (×5): 20 mg via INTRAVENOUS
  Administered 2020-11-13: 30 mg via INTRAVENOUS

## 2020-11-13 MED ORDER — SODIUM CHLORIDE 0.9 % IV SOLN: INTRAVENOUS | Status: DC

## 2020-11-13 MED ORDER — PROPOFOL 1000 MG/100ML IV EMUL
INTRAVENOUS | Status: AC
  Filled 2020-11-13: qty 100

## 2020-11-13 MED ORDER — EPHEDRINE SULFATE-NACL 50-0.9 MG/10ML-% IV SOSY
PREFILLED_SYRINGE | INTRAVENOUS | Status: DC | PRN
  Administered 2020-11-13: 15 mg via INTRAVENOUS

## 2020-11-13 MED ORDER — LIDOCAINE 2% (20 MG/ML) 5 ML SYRINGE
INTRAMUSCULAR | Status: DC | PRN
  Administered 2020-11-13: 100 mg via INTRAVENOUS

## 2020-11-13 MED ORDER — LACTATED RINGERS IV SOLN: INTRAVENOUS | Status: DC

## 2020-11-13 SURGICAL SUPPLY — 14 items

## 2020-11-13 NOTE — Anesthesia Procedure Notes (Signed)
Date/Time: 11/13/2020 7:43 AM Performed by: Talbot Grumbling, CRNA Oxygen Delivery Method: Simple face mask

## 2020-11-13 NOTE — Op Note (Signed)
[facility] Patient Name: Harold Barajas Procedure Date: 11/13/2020 MRN: [id number] Attending MD: Carlota Raspberry. Havery Moros , MD Date of Birth: 17-Oct-1950 CSN: [id number] Age: 70 Admit Type: Outpatient Procedure:                Upper GI endoscopy Indications:              Follow-up of gastric ulcer - noted on EGD November,                            large antral ulceration in the setting of NSAID                            use. Has since stopped NSAIDs, on omeprazole for                            history of short segment Barrett's and GERD,                            asymptomatic Providers:                Remo Lipps P. Havery Moros, MD, Benetta Spar,                            Technician, Cleda Daub, RN Referring MD:              Medicines:                Monitored Anesthesia Care Complications:            No immediate complications. Estimated blood loss:                            None. Estimated Blood Loss:     Estimated blood loss: none. Procedure:                Pre-Anesthesia Assessment:                           - Prior to the procedure, a History and Physical                            was performed, and patient medications and                            allergies were reviewed. The patient's tolerance of                            previous anesthesia was also reviewed. The risks                            and benefits of the procedure and the sedation                            options and risks were discussed with the patient.  All questions were answered, and informed consent                            was obtained. Prior Anticoagulants: The patient has                            taken no previous anticoagulant or antiplatelet                            agents. ASA Grade Assessment: III - A patient with                            severe systemic disease. After reviewing the risks                            and benefits, the patient was  deemed in                            satisfactory condition to undergo the procedure.                           After obtaining informed consent, the endoscope was                            passed under direct vision. Throughout the                            procedure, the patient's blood pressure, pulse, and                            oxygen saturations were monitored continuously. The                            GIF-H190 ([id number]) Olympus gastroscope was                            introduced through the mouth, and advanced to the                            second part of duodenum. The upper GI endoscopy was                            accomplished without difficulty. The patient                            tolerated the procedure well. Scope In: Scope Out: Findings:      The Z-line was irregular with a slight extension of salmon colored       mucosa and was found 40 cm from the incisors. Recently biopsied and       consistent with Barrett's, unchanged from the last exam, no further       biopsies obtained today.      A small hiatal hernia was present.      The exam of the esophagus was otherwise normal.  The entire examined stomach was normal. Complete interval healing of       prior gastric ulcer.      The duodenal bulb and second portion of the duodenum were normal. Impression:               - Z-line irregular, 40 cm from the incisors,                            consistent with short segment of Barrett's                            esophagus unchanged from recent exam, further                            biopsies not obtained.                           - Small hiatal hernia.                           - Normal stomach with interval healing of gastric                            ulcer.                           - Normal duodenal bulb and second portion of the                            duodenum. Moderate Sedation:      No moderate sedation, case performed with MAC Recommendation:            - Patient has a contact number available for                            emergencies. The signs and symptoms of potential                            delayed complications were discussed with the                            patient. Return to normal activities tomorrow.                            Written discharge instructions were provided to the                            patient.                           - Resume previous diet.                           - Continue present medications (omeprazole daily)                           - Avoid routine use of NSAIDs                           -  Repeat upper endoscopy in 3 to 5 years for                            surveillance of short segment of Barrett's. Procedure Code(s):        --- Professional ---                           [phone], Esophagogastroduodenoscopy, flexible,                            transoral; diagnostic, including collection of                            specimen(s) by brushing or washing, when performed                            (separate procedure) Diagnosis Code(s):        --- Professional ---                           K22.8, Other specified diseases of esophagus                           K44.9, Diaphragmatic hernia without obstruction or                            gangrene                           K25.9, Gastric ulcer, unspecified as acute or                            chronic, without hemorrhage or perforation CPT copyright 2019 American [employer] Association. All rights reserved. The codes documented in this report are preliminary and upon coder review may  be revised to meet current compliance requirements. Remo Lipps P. Satvik Parco, MD 11/13/2020 8:07:58 AM This report has been signed electronically. Number of Addenda: 0

## 2020-11-13 NOTE — Interval H&P Note (Signed)
History and Physical Interval Note:  11/13/2020 7:30 AM  Harold Barajas.  has presented today for surgery, with the diagnosis of Gastric ulcer.  The various methods of treatment have been discussed with the patient and family. After consideration of risks, benefits and other options for treatment, the patient has consented to  Procedure(s): ESOPHAGOGASTRODUODENOSCOPY (EGD) WITH PROPOFOL (N/A) as a surgical intervention.  The patient's history has been reviewed, patient examined, no change in status, stable for surgery.  I have reviewed the patient's chart and labs.  Questions were answered to the patient's satisfaction.     [name]

## 2020-11-13 NOTE — Anesthesia Postprocedure Evaluation (Signed)
Anesthesia Post Note  Patient: Raja Liska.  Procedure(s) Performed: ESOPHAGOGASTRODUODENOSCOPY (EGD) WITH PROPOFOL (N/A )     Patient location during evaluation: Endoscopy Anesthesia Type: MAC Level of consciousness: awake and alert Pain management: pain level controlled Vital Signs Assessment: post-procedure vital signs reviewed and stable Respiratory status: spontaneous breathing, nonlabored ventilation and respiratory function stable Cardiovascular status: stable and blood pressure returned to baseline Postop Assessment: no apparent nausea or vomiting Anesthetic complications: no   No complications documented.  Last Vitals:  Vitals:   11/13/20 0815 11/13/20 0821  BP: (!) 106/59 128/60  Pulse: 67 62  Resp: 18 16  Temp:    SpO2: 98% 99%    Last Pain:  Vitals:   11/13/20 0821  TempSrc:   PainSc: 0-No pain                 Susumu Hackler,W. EDMOND

## 2020-11-13 NOTE — H&P (Signed)
HPI :  70 y/o male with a history of large gastric ulcers on EGD in November in the setting of meloxicam and aspirin, here for EGD to confirm healing of ulcers. Patient has stopped NSAIDs, compliant with omeprazole. Previously tested negative for H pylori. At hospital for anesthesia support due to having difficult airway. No complaints otherwise today.   Past Medical History:  Diagnosis Date  . Allergy    seasonal allergies  . Angina at rest [facility])   . Arthritis    on meds  . Borderline diabetes   . Coronary artery disease    "leaky valve"  . 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 Diplopia 01/02/2016  . GERD (gastroesophageal reflux disease)    on meds  . Heart murmur    child  . High cholesterol    on meds  . History of rheumatic fever    at age 16-13  . Hypertension    on meds  . OSA treated with BiPAP    cpap  . Pre-diabetes    been on prednisone  . Sleep apnea    uses CPAP  . 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 BIOPSY  07/03/2020   Procedure: BIOPSY;  Surgeon: Yetta Flock, MD;  Location: [facility];  Service: Gastroenterology;;  . COLONOSCOPY  2006   [place]  . COLONOSCOPY WITH PROPOFOL N/A 07/03/2020   Procedure: COLONOSCOPY WITH PROPOFOL;  Surgeon: Yetta Flock, MD;  Location: [facility] ENDOSCOPY;  Service: Gastroenterology;  Laterality: N/A;  . ESOPHAGOGASTRODUODENOSCOPY (EGD) WITH PROPOFOL N/A 07/03/2020   Procedure: ESOPHAGOGASTRODUODENOSCOPY (EGD) WITH PROPOFOL;  Surgeon: Yetta Flock, MD;  Location: [facility] ENDOSCOPY;  Service: Gastroenterology;  Laterality: N/A;  . LUMBAR LAMINECTOMY/DECOMPRESSION MICRODISCECTOMY N/A 01/20/2017   Procedure: Lumbar two to Sacral one Laminectomy for Decompression;  Surgeon: Ditty, Kevan Ny, MD;  Location: [place];  Service: Neurosurgery;  Laterality: N/A;  . POLYPECTOMY  07/03/2020   Procedure: POLYPECTOMY;  Surgeon: Yetta Flock, MD;  Location: [facility]  ENDOSCOPY;  Service: Gastroenterology;;   Family History  Problem Relation Age of Onset  . Stroke Mother   . Heart attack Mother   . Aneurysm Father 65  . Crohn's disease Father   . Other Father        Crohn's dx (dx age 49)  . Diabetes Brother   . Heart attack Brother   . Leukemia Maternal Grandmother   . Colon cancer Neg Hx   . Rectal cancer Neg Hx   . Esophageal cancer Neg Hx   . Colon polyps Neg Hx   . Stomach cancer Neg Hx    Social History   Tobacco Use  . Smoking status: Former Smoker    Packs/day: 1.00    Years: 20.00    Pack years: 20.00    Quit date: 01/08/1980    Years since quitting: 40.8  . Smokeless tobacco: Never Used  . Tobacco comment: Quit 1982  Vaping Use  . Vaping Use: Never used  Substance Use Topics  . Alcohol use: Yes    Alcohol/week: 8.0 standard drinks    Types: 8 Glasses of wine per week  . Drug use: No   Current Facility-Administered Medications  Medication Dose Route Frequency Provider Last Rate Last Admin  . 0.9 %  sodium chloride infusion   Intravenous Continuous [place] Capers, Carlota Raspberry, MD      . lactated ringers infusion   Intravenous Continuous Havery Moros Remo Lipps  P, MD       Facility-Administered Medications Ordered in Other Encounters  Medication Dose Route Frequency Provider Last Rate Last Admin  . gadopentetate dimeglumine (MAGNEVIST) injection 20 mL  20 mL Intravenous Once PRN Kathrynn Ducking, MD       Allergies  Allergen Reactions  . Penicillins Other (See Comments)    Unknown reaction Has patient had a PCN reaction causing immediate rash, facial/tongue/throat swelling, SOB or lightheadedness with hypotension: unknown Has patient had a PCN reaction causing severe rash involving mucus membranes or skin necrosis: unknown Has patient had a PCN reaction that required hospitalization unknown Has patient had a PCN reaction occurring within the last 10 years: No If all of the above answers are "NO", then may proceed with Cephalosporin  use Childhood reaction.      Review of Systems: All systems reviewed and negative except where noted in HPI.   Lab Results  Component Value Date   WBC 11.1 (H) 01/21/2017   HGB 12.6 (L) 01/21/2017   HCT 36.3 (L) 01/21/2017   MCV 90.1 01/21/2017   PLT 187 01/21/2017    Lab Results  Component Value Date   CREATININE 1.30 (H) 01/21/2017   BUN 19 01/21/2017   NA 134 (L) 01/21/2017   K 5.2 (H) 01/21/2017   CL 100 (L) 01/21/2017   CO2 26 01/21/2017    Lab Results  Component Value Date   ALT 23 01/02/2016   AST 16 01/02/2016   ALKPHOS 126 (H) 01/02/2016   BILITOT 0.3 01/02/2016    Physical Exam: BP 137/66   Temp 98.3 F (36.8 C) (Oral)   Resp 17   Ht 6' (1.829 m)   Wt 110.2 kg   SpO2 99%   BMI 32.96 kg/m  Constitutional: Pleasant,well-developed, male in no acute distress. Cardiovascular: Normal rate, regular rhythm.  Pulmonary/chest: Effort normal and breath sounds normal. No wheezing, rales or rhonchi. Abdominal: Soft, nondistended, nontender.  There are no masses palpable.  Extremities: no edema Neurological: Alert and oriented to person place and time. Psychiatric: Normal mood and affect. Behavior is normal.   ASSESSMENT AND PLAN: 70 y/o male with a history of large gastric ulcers on prior EGD, at the hospital for surveillance EGD today to ensure mucosal healing, given difficult airway, at hospital for anesthesia support. I have discussed risks / benefits of anesthesia and EGD with him. He agrees. Off NSAIDs now and compliant with PPI. Further recommendations pending results of his exam.    [place], MD [facility] Gastroenterology

## 2020-11-13 NOTE — Discharge Instructions (Signed)
YOU HAD AN ENDOSCOPIC PROCEDURE TODAY: Refer to the procedure report and other information in the discharge instructions given to you for any specific questions about what was found during the examination. If this information does not answer your questions, please call [place] office at [phone] to clarify.   YOU SHOULD EXPECT: Some feelings of bloating in the abdomen. Passage of more gas than usual. Walking can help get rid of the air that was put into your GI tract during the procedure and reduce the bloating. If you had a lower endoscopy (such as a colonoscopy or flexible sigmoidoscopy) you may notice spotting of blood in your stool or on the toilet paper. Some abdominal soreness may be present for a day or two, also.  DIET: Your first meal following the procedure should be a light meal and then it is ok to progress to your normal diet. A half-sandwich or bowl of soup is an example of a good first meal. Heavy or fried foods are harder to digest and may make you feel nauseous or bloated. Drink plenty of fluids but you should avoid alcoholic beverages for 24 hours. If you had a esophageal dilation, please see attached instructions for diet.    ACTIVITY: Your care partner should take you home directly after the procedure. You should plan to take it easy, moving slowly for the rest of the day. You can resume normal activity the day after the procedure however YOU SHOULD NOT DRIVE, use power tools, machinery or perform tasks that involve climbing or major physical exertion for 24 hours (because of the sedation medicines used during the test).   SYMPTOMS TO REPORT IMMEDIATELY: A gastroenterologist can be reached at any hour. Please call [phone]  for any of the following symptoms:   Following upper endoscopy (EGD, EUS, ERCP, esophageal dilation) Vomiting of blood or coffee ground material  New, significant abdominal pain  New, significant chest pain or pain under the shoulder blades  Painful or  persistently difficult swallowing  New shortness of breath  Black, tarry-looking or red, bloody stools  FOLLOW UP:  If any biopsies were taken you will be contacted by phone or by letter within the next 1-3 weeks. Call [phone]  if you have not heard about the biopsies in 3 weeks.  Please also call with any specific questions about appointments or follow up tests.  

## 2020-11-13 NOTE — Transfer of Care (Signed)
Immediate Anesthesia Transfer of Care Note  Patient: Harold Barajas.  Procedure(s) Performed: ESOPHAGOGASTRODUODENOSCOPY (EGD) WITH PROPOFOL (N/A )  Patient Location: PACU  Anesthesia Type:MAC  Level of Consciousness: sedated  Airway & Oxygen Therapy: Patient Spontanous Breathing and Patient connected to face mask oxygen  Post-op Assessment: Report given to RN and Post -op Vital signs reviewed and stable  Post vital signs: Reviewed and stable  Last Vitals:  Vitals Value Taken Time  BP    Temp    Pulse    Resp    SpO2      Last Pain:  Vitals:   11/13/20 0726  TempSrc: Oral  PainSc: 0-No pain         Complications: No complications documented.

## 2020-11-15 ENCOUNTER — Encounter (HOSPITAL_COMMUNITY): Payer: Self-pay | Admitting: Gastroenterology

## 2021-04-24 ENCOUNTER — Other Ambulatory Visit: Payer: Self-pay | Admitting: Gastroenterology

## 2021-11-10 ENCOUNTER — Other Ambulatory Visit: Payer: Self-pay | Admitting: Gastroenterology

## 2022-02-12 ENCOUNTER — Other Ambulatory Visit: Payer: Self-pay | Admitting: Gastroenterology

## 2023-05-02 LAB — LAB REPORT - SCANNED: EGFR: 52

## 2023-06-05 ENCOUNTER — Ambulatory Visit (INDEPENDENT_AMBULATORY_CARE_PROVIDER_SITE_OTHER): Payer: Medicare Other | Admitting: Physician Assistant

## 2023-06-05 ENCOUNTER — Encounter: Payer: Self-pay | Admitting: Physician Assistant

## 2023-06-05 ENCOUNTER — Other Ambulatory Visit (INDEPENDENT_AMBULATORY_CARE_PROVIDER_SITE_OTHER): Payer: Medicare Other

## 2023-06-05 VITALS — BP 136/72 | HR 68 | Ht 72.0 in | Wt 224.0 lb

## 2023-06-05 DIAGNOSIS — D126 Benign neoplasm of colon, unspecified: Secondary | ICD-10-CM

## 2023-06-05 DIAGNOSIS — K219 Gastro-esophageal reflux disease without esophagitis: Secondary | ICD-10-CM | POA: Diagnosis not present

## 2023-06-05 DIAGNOSIS — K2270 Barrett's esophagus without dysplasia: Secondary | ICD-10-CM

## 2023-06-05 DIAGNOSIS — K259 Gastric ulcer, unspecified as acute or chronic, without hemorrhage or perforation: Secondary | ICD-10-CM

## 2023-06-05 DIAGNOSIS — R197 Diarrhea, unspecified: Secondary | ICD-10-CM | POA: Diagnosis not present

## 2023-06-05 DIAGNOSIS — T884XXD Failed or difficult intubation, subsequent encounter: Secondary | ICD-10-CM

## 2023-06-05 DIAGNOSIS — R1032 Left lower quadrant pain: Secondary | ICD-10-CM

## 2023-06-05 DIAGNOSIS — R634 Abnormal weight loss: Secondary | ICD-10-CM

## 2023-06-05 LAB — CBC WITH DIFFERENTIAL/PLATELET
Basophils Absolute: 0 10*3/uL (ref 0.0–0.1)
Basophils Relative: 0.8 % (ref 0.0–3.0)
Eosinophils Absolute: 0.1 10*3/uL (ref 0.0–0.7)
Eosinophils Relative: 2.5 % (ref 0.0–5.0)
HCT: 45.5 % (ref 39.0–52.0)
Hemoglobin: 15.2 g/dL (ref 13.0–17.0)
Lymphocytes Relative: 19.7 % (ref 12.0–46.0)
Lymphs Abs: 1.1 10*3/uL (ref 0.7–4.0)
MCHC: 33.5 g/dL (ref 30.0–36.0)
MCV: 93.5 fL (ref 78.0–100.0)
Monocytes Absolute: 0.3 10*3/uL (ref 0.1–1.0)
Monocytes Relative: 5.5 % (ref 3.0–12.0)
Neutro Abs: 4.2 10*3/uL (ref 1.4–7.7)
Neutrophils Relative %: 71.5 % (ref 43.0–77.0)
Platelets: 202 10*3/uL (ref 150.0–400.0)
RBC: 4.86 Mil/uL (ref 4.22–5.81)
RDW: 13.2 % (ref 11.5–15.5)
WBC: 5.8 10*3/uL (ref 4.0–10.5)

## 2023-06-05 LAB — SEDIMENTATION RATE: Sed Rate: 10 mm/h (ref 0–20)

## 2023-06-05 LAB — COMPREHENSIVE METABOLIC PANEL
ALT: 18 U/L (ref 0–53)
AST: 18 U/L (ref 0–37)
Albumin: 4.3 g/dL (ref 3.5–5.2)
Alkaline Phosphatase: 83 U/L (ref 39–117)
BUN: 16 mg/dL (ref 6–23)
CO2: 30 meq/L (ref 19–32)
Calcium: 9.3 mg/dL (ref 8.4–10.5)
Chloride: 103 meq/L (ref 96–112)
Creatinine, Ser: 1.14 mg/dL (ref 0.40–1.50)
GFR: 64.64 mL/min (ref >60.00)
Glucose, Bld: 119 mg/dL — ABNORMAL HIGH (ref 70–99)
Potassium: 4.3 meq/L (ref 3.5–5.1)
Sodium: 141 meq/L (ref 135–145)
Total Bilirubin: 0.5 mg/dL (ref 0.2–1.2)
Total Protein: 7.1 g/dL (ref 6.0–8.3)

## 2023-06-05 LAB — TSH: TSH: 1.15 u[IU]/mL (ref 0.35–5.50)

## 2023-06-05 MED ORDER — NA SULFATE-K SULFATE-MG SULF 17.5-3.13-1.6 GM/177ML PO SOLN: ORAL | 0 refills | Status: AC

## 2023-06-05 NOTE — Progress Notes (Signed)
06/05/2023 Harold Barajas [id number] 09/22/1950  Referring provider: Garald Braver, FNP Primary GI doctor: Dr. Adela Lank  ASSESSMENT AND PLAN:   Diarrhea x May with nocturnal symptoms, AB cramping, bloating, and weight loss, colon 2021 recall 3 years Had negative stool studies with PCP, will request Will get CT AB and pelvis with contrast Discuss with PCP/cardio switching benicar with weight loss/diarrhea can be an enteropathy Has family history of autoimmune, will get Sed rate. Check CBC, CMET, celiac, alpha gal Schedule for colon at [place] due to history of difficult intubation, patient is due next month for history of multiple TA polyps, rule out microscopic colitis.   Gastroesophageal reflux disease/gastric ulcer, Barrett's Some concern for small bowel enteroscopy with weight loss and patient's symptoms Recall 3-5 years with diarrhea and patient being difficult intubation, will plan on EGD with colonoscopy at [facility] to rule out enteroscopy.  Continue PPI daily GERD information given  Hard to intubate, subsequent encounter Will be scheduled at [facility]  Risk of bowel prep, conscious sedation, and EGD and colonoscopy were discussed.  Risks include but are not limited to dehydration, pain, bleeding, cardiopulmonary process, bowel perforation, or other possible adverse outcomes..  Treatment plan was discussed with patient, and agreed upon.   Patient Care Team: Garald Braver as PCP - General (Nurse Practitioner)  HISTORY OF PRESENT ILLNESS: 72 y.o. male with a past medical history of hypertension, type 2 diabetes, coronary artery disease, OSA, GERD, history of gastric ulcer, personal history of colon polyps with history of difficult intubation and others listed below presents for evaluation of diarrhea.   07/03/2020 colonoscopy and EGD: For screening purposes bowel prep good 3 polyps 3 to 5 mm ascending, 2 polyps 3 to 5 mm transverse, 1 polyp 5 mm splenic  flexure, 3 mm polyp descending colon, diverticulosis sigmoid, anal papilla hypertrophy EGD showed nonbleeding large flat superficial gastric ulcer no stigmata of bleeding, gastritis, Z-line irregular, three 6 cm hiatal hernia Path showed nonreactive gastropathy with erosions negative H. pylori, Pyrtle cell hyperplasia with PPI, esophageal squamous intestinal metaplasia negative for dysplasia.  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 04/28/2023 patient had [facility] in [place] [facility], appointment for diarrhea, unable to see report. He intentionally lost about 25 lbs Nov 2023 for preDM/diabetes to avoid medications. Stools may have changed slightly then.  He started to have diarrhea end of July while on vacation in [place], no sick contacts.  He was given Cipro/flagyl by PCP 08/27, was given stool test, had negative GI pathogen, ? Cdiff, I'm unable to see these reports. This did not help, may have made it worse.  He has had nocturnal symptoms x 2-3 times, IE last night was up at 230 AM, then up at 530 AM and has had two more Bm's.  Worse in the morning.  He is having at least 3-4 Bm's a day, no hematochezia or melena.  He does have urgency, lower AB cramping/pressure.  Has avoided golfing in the morning.  He feels very bloated/pressure but no increase in gas.  He has always had reflux, has been on it for years well controlled, no nausea, vomiting.  No fever, chills.  Has rash under arm a week ago, was using all natural deodorant.  He has  taken pepto for a few days that helped.  May be worse with meats but no tick exposure.  Started flomax in May.   Patient denies family history of colon cancer or other gastrointestinal malignancies. Father with crohn's  disease  He denies blood thinner use.  He denies NSAID use.  He reports ETOH use, 2 beers on Tuesday, 2 bottles of wine a week.  He denies tobacco use.  He denies drug use.    He  reports that he quit smoking about 43 years ago. He started smoking about 63 years ago. He has a 20 pack-year smoking history. He has never used smokeless tobacco. He reports current alcohol use of about 8.0 standard drinks of alcohol per week. He reports that he does not use drugs.  RELEVANT LABS AND IMAGING:  Results          CBC    Component Value Date/Time   WBC 11.1 (H) 01/21/2017 0623   RBC 4.03 (L) 01/21/2017 0623   HGB 12.6 (L) 01/21/2017 0623   HCT 36.3 (L) 01/21/2017 0623   PLT 187 01/21/2017 0623   MCV 90.1 01/21/2017 0623   MCH 31.3 01/21/2017 0623   MCHC 34.7 01/21/2017 0623   RDW 12.2 01/21/2017 0623   No results for input(s): "HGB" in the last 8760 hours.  CMP     Component Value Date/Time   NA 134 (L) 01/21/2017 0623   NA 141 01/02/2016 1600   K 5.2 (H) 01/21/2017 0623   CL 100 (L) 01/21/2017 0623   CO2 26 01/21/2017 0623   GLUCOSE 208 (H) 01/21/2017 0623   BUN 19 01/21/2017 0623   BUN 15 01/02/2016 1600   CREATININE 1.30 (H) 01/21/2017 0623   CALCIUM 8.3 (L) 01/21/2017 0623   PROT 6.8 01/02/2016 1600   ALBUMIN 4.2 01/02/2016 1600   AST 16 01/02/2016 1600   ALT 23 01/02/2016 1600   ALKPHOS 126 (H) 01/02/2016 1600   BILITOT 0.3 01/02/2016 1600   GFRNONAA 56 (L) 01/21/2017 0623   GFRAA >60 01/21/2017 0623      Latest Ref Rng & Units 01/02/2016    4:00 PM  Hepatic Function  Total Protein 6.0 - 8.5 g/dL 6.8   Albumin 3.6 - 4.8 g/dL 4.2   AST 0 - 40 IU/L 16   ALT 0 - 44 IU/L 23   Alk Phosphatase 39 - 117 IU/L 126   Total Bilirubin 0.0 - 1.2 mg/dL 0.3       Current Medications:     Current Outpatient Medications (Cardiovascular):    carvedilol (COREG) 25 MG tablet, Take 25 mg by mouth 2 (two) times daily.   olmesartan (BENICAR) 40 MG tablet, Take 40 mg by mouth  every evening.    simvastatin (ZOCOR) 20 MG tablet, Take 20 mg by mouth at bedtime.   Current Outpatient Medications (Respiratory):    cetirizine (ZYRTEC) 10 MG tablet, Take 10 mg by mouth daily.   Current Outpatient Medications (Analgesics):    acetaminophen (TYLENOL) 500 MG tablet, Take 500 mg by mouth every 6 (six) hours as needed (for pain.).   aspirin EC 81 MG tablet, Take 81 mg by mouth every evening. Swallow whole.   Current Outpatient Medications (Hematological):    cyanocobalamin 2000 MCG tablet, Take 2,000 mcg by mouth daily.   Current Outpatient Medications (Other):    Cholecalciferol (DIALYVITE VITAMIN D 5000) 125 MCG (5000 UT) capsule, Take 5,000 Units by mouth See admin instructions. Take 5000 units 6 days a week, skip  Sunday dose   Cholecalciferol (VITAMIN D3) 1.25 MG (50000 UT) CAPS, Take 50,000 Units by mouth every Sunday.   Multiple Vitamin (MULTIVITAMIN WITH MINERALS) TABS tablet, Take 1 tablet by mouth daily.   Omega-3 Fatty Acids (FISH OIL PO), Take 2,000 mg by mouth daily.   omeprazole (PRILOSEC) 40 MG capsule, Take 1 capsule (40 mg total) by mouth daily. Please schedule a yearly follow up for further refills. Thank you.   Sodium Sulfate-Mag Sulfate-KCl (SUTAB) [phone] MG TABS, Take 1 kit by mouth as directed. MANUFACTURER CODES!! BIN: [id number] PCN: CN GROUP: [id number] MEMBER ID: [id number];VHQ AS CASH;NO PRIOR AUTHORIZATION   Facility-Administered Medications Ordered in Other Visits (Other):    gadopentetate dimeglumine (MAGNEVIST) injection 20 mL No current facility-administered medications for this visit.  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      Surgical History:  He  has a past surgical history that includes Lumbar laminectomy/decompression microdiscectomy (N/A, 01/20/2017); Colonoscopy (2006); Colonoscopy with propofol (N/A, 07/03/2020); Esophagogastroduodenoscopy (egd) with propofol (N/A, 07/03/2020); biopsy (07/03/2020); polypectomy (07/03/2020); and Esophagogastroduodenoscopy (egd) with propofol (N/A, 11/13/2020). Family History:  His family history includes Aneurysm (age of onset: 82) in his father; Crohn's disease in his father; Diabetes in his brother; Heart attack in his brother and mother; Leukemia in his maternal grandmother; Other in his father; Stroke in his mother.  REVIEW OF SYSTEMS  : All other systems reviewed and negative except where noted in the History of Present Illness.  PHYSICAL EXAM: There were no vitals taken for this visit. General Appearance: Well nourished, in no apparent distress. Head:   Normocephalic and atraumatic. Eyes:  sclerae anicteric,conjunctive pink  Respiratory: Respiratory effort normal, BS equal bilaterally without rales, rhonchi, wheezing. Cardio: RRR with no MRGs. Peripheral pulses intact.   Abdomen: Soft,  Obese ,active bowel sounds. No tenderness . Without guarding and Without rebound. No masses. Rectal: Not evaluated Musculoskeletal: Full ROM, Normal gait. With edema. Skin:  Dry and intact without significant lesions or rashes Neuro: Alert and  oriented x4;  No focal deficits. Psych:  Cooperative. Normal mood and affect.    Doree Albee, PA-C 8:03 AM

## 2023-06-05 NOTE — Patient Instructions (Signed)
You have been scheduled for a CT scan of the abdomen and pelvis at [facility] 1st floor Radiology. You are scheduled on 06/26/23 at 12 pm. You should arrive 9:45 am minutes prior to your appointment time for registration. Nothing to eat or drink after midnight   You may take any medications as prescribed with a small amount of water, if necessary. If you take any of the following medications: METFORMIN, GLUCOPHAGE, GLUCOVANCE, AVANDAMET, RIOMET, FORTAMET, ACTOPLUS MET, JANUMET, GLUMETZA or METAGLIP, you MAY be asked to HOLD this medication 48 hours AFTER the exam.   The purpose of you drinking the oral contrast is to aid in the visualization of your intestinal tract. The contrast solution may cause some diarrhea. Depending on your individual set of symptoms, you may also receive an intravenous injection of x-ray contrast/dye. Plan on being at [facility] for 45 minutes or longer, depending on the type of exam you are having performed.   If you have any questions regarding your exam or if you need to reschedule, you may call [facility] at [phone] between the hours of 8:00 am and 5:00 pm, Monday-Friday.     Please try to switch the olmesartan to a different ARB like losartan, diovan, etc Olmestartan can cause enteropathy  Will schedule colon and EGD at [facility]  Your provider has requested that you go to the basement level for lab work before leaving today. Press "B" on the elevator. The lab is located at the first door on the left as you exit the elevator.  You have been scheduled for an endoscopy and colonoscopy. Please follow the written instructions given to you at your visit today.  Please pick up your prep supplies at the pharmacy within the next 1-3 days.  If you use inhalers (even only as needed), please bring them with you on the day of your procedure.  DO NOT TAKE 7 DAYS PRIOR TO TEST- Trulicity (dulaglutide) Ozempic, Wegovy (semaglutide) Mounjaro  (tirzepatide) Bydureon Bcise (exanatide extended release)  DO NOT TAKE 1 DAY PRIOR TO YOUR TEST Rybelsus (semaglutide) Adlyxin (lixisenatide) Victoza (liraglutide) Byetta (exanatide) ___________________________________________________________________________ We have sent the following medications to your pharmacy for you to pick up at your convenience: Suprep  - Avoid high fat foods, as they can make diarrhea worse.  - Dairy products (except yogurt) may be difficult to digest when you have diarrhea. I recommend that you temporarily avoid lactose-containing foods.  - Can try loperamide 4 mg initially, then 2 mg after each unformed stool for =2 days, with a maximum of 16 mg/day.  - If loperamide is not working, you could try bismuth salicylate (Pepto-Bismol) 30 mL or two tablets every 30 minutes for eight doses. Pepto-Bismol may make your stools black.   First do a trial off milk/lactose products if you use them.  Add fiber like benefiber or citracel once a day Increase activity Can do trial of IBGard which is over the counter for AB pain- Take 1-2 capsules once a day for maintence or twice a day during a flare Can send in an anti spasm medication, Levsin, to take as needed    FODMAP stands for fermentable oligo-, di-, mono-saccharides and polyols (1). These are the scientific terms used to classify groups of carbs that are difficult for our body to digest and that are notorious for triggering digestive symptoms like bloating, gas, loose stools and stomach pain.   You can try low FODMAP diet  - start with eliminating just one column  at a time that you feel may be a trigger for you. - the table at the very bottom contains foods that are low in FODMAPs   Sometimes trying to eliminate the FODMAP's from your diet is difficult or tricky, if you are stuggling with trying to do the elimination diet you can try an enzyme.  There is a food enzymes that you sprinkle in or on your food that helps  break down the FODMAP. You can read more about the enzyme by going to this site: https://fodzyme.com/    Go to the ER if any severe abdominal pain, fever, or weakness

## 2023-06-06 LAB — TISSUE TRANSGLUTAMINASE, IGA: (tTG) Ab, IgA: <1 U/mL

## 2023-06-06 LAB — IGA: Immunoglobulin A: 389 mg/dL — ABNORMAL HIGH (ref 70–320)

## 2023-06-08 ENCOUNTER — Telehealth: Payer: Self-pay

## 2023-06-08 NOTE — Progress Notes (Signed)
Agree with assessment and plan with the following thoughts.  He is due for surveillance colonoscopy and will need to be scheduled at [facility] due to history of difficult airway in the past.  His Barrett's is a very short segment, low risk, I think okay to wait 5 years from his last exam, so does not need an EGD now.  Jan can you help book this patient for colonoscopy at [facility], next opening suspect may be December or January? I have added him to the wait list. Thanks

## 2023-06-08 NOTE — Telephone Encounter (Signed)
Pre visit cancelled as patient was instructed in the office.

## 2023-06-08 NOTE — Telephone Encounter (Signed)
Author: Benancio Deeds, MD Service: Gastroenterology Author Type: Physician  Filed: 06/08/2023  8:40 AM Encounter Date: 06/05/2023 Status: Signed  Editor: Armbruster, Willaim Rayas, MD (Physician)  .   He is due for surveillance colonoscopy and will need to be scheduled at [facility] due to history of difficult airway in the past.   His Barrett's is a very short segment, low risk, I think okay to wait 5 years from his last exam, so does not need an EGD now.   Jan can you help book this patient for colonoscopy at [facility], next opening suspect may be December or January? I have added him to the wait list. Thanks     Patient is scheduled for Colonoscopy at [facility] on 08-08-23 at 10:45am.  PV scheduled for 12-2 at 10:00 am. Letter mailed to patient with appointment information.

## 2023-06-23 ENCOUNTER — Telehealth: Payer: Self-pay | Admitting: Physician Assistant

## 2023-06-23 NOTE — Telephone Encounter (Signed)
Yes okay to keep appointments as scheduled. Thanks

## 2023-06-23 NOTE — Telephone Encounter (Signed)
Inbound call from patients wife stating that patient is scheduled for a colonoscopy with Dr. Adela Lank on 12/17 at [facility] and she stated that patient will be having a prostate biopsy on 12/3. Patient wife is requesting a call back to discuss if patient will be okay to proceed with colonoscopy. Please advise.

## 2023-06-23 NOTE — Telephone Encounter (Signed)
Spoke with wife and she is aware of recommendations per Dr. Adela Lank.

## 2023-06-23 NOTE — Telephone Encounter (Signed)
Dr. Adela Lank please see note below and advise if ok to keep Colon appt as scheduled.

## 2023-06-26 ENCOUNTER — Ambulatory Visit (HOSPITAL_COMMUNITY)
Admission: RE | Admit: 2023-06-26 | Discharge: 2023-06-26 | Disposition: A | Discharge status: Home / Self Care | Payer: Medicare Other | Source: Ambulatory Visit | Attending: Physician Assistant | Admitting: Physician Assistant

## 2023-06-26 DIAGNOSIS — R1032 Left lower quadrant pain: Secondary | ICD-10-CM | POA: Insufficient documentation

## 2023-06-26 DIAGNOSIS — R197 Diarrhea, unspecified: Secondary | ICD-10-CM | POA: Diagnosis present

## 2023-06-26 MED ORDER — IOHEXOL 300 MG/ML  SOLN
100.0000 mL | Freq: Once | INTRAMUSCULAR | Status: AC | PRN
  Administered 2023-06-26: 100 mL via INTRAVENOUS

## 2023-06-26 MED ORDER — IOHEXOL 9 MG/ML PO SOLN
ORAL | Status: AC
  Filled 2023-06-26: qty 1000

## 2023-08-12 ENCOUNTER — Encounter (HOSPITAL_COMMUNITY): Payer: Self-pay | Admitting: Gastroenterology

## 2023-08-18 ENCOUNTER — Encounter (HOSPITAL_COMMUNITY)
Admission: RE | Disposition: A | Discharge status: Home / Self Care | Payer: Self-pay | Source: Home / Self Care | Attending: Gastroenterology

## 2023-08-18 ENCOUNTER — Other Ambulatory Visit: Payer: Self-pay

## 2023-08-18 ENCOUNTER — Ambulatory Visit (HOSPITAL_COMMUNITY)
Admission: RE | Admit: 2023-08-18 | Discharge: 2023-08-18 | Disposition: A | Discharge status: Home / Self Care | Payer: Medicare Other | Attending: Gastroenterology | Admitting: Gastroenterology

## 2023-08-18 ENCOUNTER — Ambulatory Visit (HOSPITAL_COMMUNITY): Payer: Medicare Other | Admitting: Anesthesiology

## 2023-08-18 ENCOUNTER — Ambulatory Visit (HOSPITAL_BASED_OUTPATIENT_CLINIC_OR_DEPARTMENT_OTHER): Payer: Medicare Other | Admitting: Anesthesiology

## 2023-08-18 DIAGNOSIS — K2100 Gastro-esophageal reflux disease with esophagitis, without bleeding: Secondary | ICD-10-CM | POA: Diagnosis not present

## 2023-08-18 DIAGNOSIS — K5730 Diverticulosis of large intestine without perforation or abscess without bleeding: Secondary | ICD-10-CM | POA: Insufficient documentation

## 2023-08-18 DIAGNOSIS — D123 Benign neoplasm of transverse colon: Secondary | ICD-10-CM | POA: Insufficient documentation

## 2023-08-18 DIAGNOSIS — Z860101 Personal history of adenomatous and serrated colon polyps: Secondary | ICD-10-CM

## 2023-08-18 DIAGNOSIS — Z87891 Personal history of nicotine dependence: Secondary | ICD-10-CM | POA: Diagnosis not present

## 2023-08-18 DIAGNOSIS — K2270 Barrett's esophagus without dysplasia: Secondary | ICD-10-CM

## 2023-08-18 DIAGNOSIS — K449 Diaphragmatic hernia without obstruction or gangrene: Secondary | ICD-10-CM

## 2023-08-18 DIAGNOSIS — D124 Benign neoplasm of descending colon: Secondary | ICD-10-CM

## 2023-08-18 DIAGNOSIS — I10 Essential (primary) hypertension: Secondary | ICD-10-CM | POA: Insufficient documentation

## 2023-08-18 DIAGNOSIS — D126 Benign neoplasm of colon, unspecified: Secondary | ICD-10-CM

## 2023-08-18 DIAGNOSIS — K317 Polyp of stomach and duodenum: Secondary | ICD-10-CM | POA: Diagnosis not present

## 2023-08-18 DIAGNOSIS — Z860100 Personal history of colon polyps, unspecified: Secondary | ICD-10-CM

## 2023-08-18 DIAGNOSIS — K2950 Unspecified chronic gastritis without bleeding: Secondary | ICD-10-CM | POA: Insufficient documentation

## 2023-08-18 DIAGNOSIS — G4733 Obstructive sleep apnea (adult) (pediatric): Secondary | ICD-10-CM | POA: Insufficient documentation

## 2023-08-18 DIAGNOSIS — R1032 Left lower quadrant pain: Secondary | ICD-10-CM

## 2023-08-18 DIAGNOSIS — Z1211 Encounter for screening for malignant neoplasm of colon: Secondary | ICD-10-CM

## 2023-08-18 DIAGNOSIS — E119 Type 2 diabetes mellitus without complications: Secondary | ICD-10-CM | POA: Insufficient documentation

## 2023-08-18 DIAGNOSIS — K259 Gastric ulcer, unspecified as acute or chronic, without hemorrhage or perforation: Secondary | ICD-10-CM

## 2023-08-18 DIAGNOSIS — K648 Other hemorrhoids: Secondary | ICD-10-CM | POA: Insufficient documentation

## 2023-08-18 DIAGNOSIS — I2510 Atherosclerotic heart disease of native coronary artery without angina pectoris: Secondary | ICD-10-CM | POA: Insufficient documentation

## 2023-08-18 DIAGNOSIS — K219 Gastro-esophageal reflux disease without esophagitis: Secondary | ICD-10-CM

## 2023-08-18 DIAGNOSIS — K2289 Other specified disease of esophagus: Secondary | ICD-10-CM

## 2023-08-18 DIAGNOSIS — R197 Diarrhea, unspecified: Secondary | ICD-10-CM

## 2023-08-18 DIAGNOSIS — T884XXD Failed or difficult intubation, subsequent encounter: Secondary | ICD-10-CM

## 2023-08-18 DIAGNOSIS — Z09 Encounter for follow-up examination after completed treatment for conditions other than malignant neoplasm: Secondary | ICD-10-CM | POA: Insufficient documentation

## 2023-08-18 DIAGNOSIS — R634 Abnormal weight loss: Secondary | ICD-10-CM

## 2023-08-18 HISTORY — PX: ESOPHAGOGASTRODUODENOSCOPY (EGD) WITH PROPOFOL: SHX5813

## 2023-08-18 HISTORY — PX: COLONOSCOPY WITH PROPOFOL: SHX5780

## 2023-08-18 HISTORY — PX: POLYPECTOMY: SHX5525

## 2023-08-18 HISTORY — PX: BIOPSY: SHX5522

## 2023-08-18 SURGERY — COLONOSCOPY WITH PROPOFOL
Anesthesia: Monitor Anesthesia Care

## 2023-08-18 MED ORDER — PROPOFOL 500 MG/50ML IV EMUL
INTRAVENOUS | Status: DC | PRN
  Administered 2023-08-18: 125 ug/kg/min via INTRAVENOUS

## 2023-08-18 MED ORDER — PROPOFOL 10 MG/ML IV BOLUS
INTRAVENOUS | Status: DC | PRN
  Administered 2023-08-18 (×2): 40 mg via INTRAVENOUS

## 2023-08-18 MED ORDER — LIDOCAINE 2% (20 MG/ML) 5 ML SYRINGE
INTRAMUSCULAR | Status: DC | PRN
  Administered 2023-08-18: 60 mg via INTRAVENOUS

## 2023-08-18 MED ORDER — SODIUM CHLORIDE 0.9 % IV SOLN: INTRAVENOUS | Status: DC

## 2023-08-18 SURGICAL SUPPLY — 24 items
BLOCK BITE 60FR ADLT L/F BLUE (MISCELLANEOUS) ×2 IMPLANT
ELECT REM PT RETURN 9FT ADLT (ELECTROSURGICAL)
ELECTRODE REM PT RTRN 9FT ADLT (ELECTROSURGICAL) IMPLANT
FCP BXJMBJMB 240X2.8X (CUTTING FORCEPS)
FLOOR PAD 36X40 (MISCELLANEOUS) ×2
FORCEP RJ3 GP 1.8X160 W-NEEDLE (CUTTING FORCEPS) IMPLANT
FORCEPS BIOP RAD 4 LRG CAP 4 (CUTTING FORCEPS) IMPLANT
FORCEPS BIOP RJ4 240 W/NDL (CUTTING FORCEPS)
FORCEPS BXJMBJMB 240X2.8X (CUTTING FORCEPS) IMPLANT
INJECTOR/SNARE I SNARE (MISCELLANEOUS) IMPLANT
LUBRICANT JELLY 4.5OZ STERILE (MISCELLANEOUS) IMPLANT
MANIFOLD NEPTUNE II (INSTRUMENTS) IMPLANT
NDL SCLEROTHERAPY 25GX240 (NEEDLE) IMPLANT
NEEDLE SCLEROTHERAPY 25GX240 (NEEDLE)
PAD FLOOR 36X40 (MISCELLANEOUS) ×2 IMPLANT
PROBE APC STR FIRE (PROBE) IMPLANT
PROBE INJECTION GOLD 7FR (MISCELLANEOUS) IMPLANT
SNARE ROTATE MED OVAL 20MM (MISCELLANEOUS) IMPLANT
SNARE SHORT THROW 13M SML OVAL (MISCELLANEOUS) IMPLANT
SYR 50ML LL SCALE MARK (SYRINGE) IMPLANT
TRAP SPECIMEN MUCOUS 40CC (MISCELLANEOUS) IMPLANT
TUBING ENDO SMARTCAP PENTAX (MISCELLANEOUS) ×4 IMPLANT
TUBING IRRIGATION ENDOGATOR (MISCELLANEOUS) ×2 IMPLANT
WATER STERILE IRR 1000ML POUR (IV SOLUTION) IMPLANT

## 2023-08-18 NOTE — Anesthesia Postprocedure Evaluation (Signed)
Anesthesia Post Note  Patient: Harold Barajas.  Procedure(s) Performed: COLONOSCOPY WITH PROPOFOL ESOPHAGOGASTRODUODENOSCOPY (EGD) WITH PROPOFOL POLYPECTOMY BIOPSY     Patient location during evaluation: PACU Anesthesia Type: MAC Level of consciousness: awake and alert Pain management: pain level controlled Vital Signs Assessment: post-procedure vital signs reviewed and stable Respiratory status: spontaneous breathing, nonlabored ventilation and respiratory function stable Cardiovascular status: stable and blood pressure returned to baseline Anesthetic complications: no   No notable events documented.  Last Vitals:  Vitals:   08/18/23 1152 08/18/23 1200  BP: 139/67 (!) 145/63  Pulse: 71 75  Resp: 19 17  Temp:    SpO2: 100% 100%    Last Pain:  Vitals:   08/18/23 1200  TempSrc:   PainSc: 0-No pain                 Beryle Lathe

## 2023-08-18 NOTE — Transfer of Care (Signed)
Immediate Anesthesia Transfer of Care Note  Patient: Harold Barajas.  Procedure(s) Performed: COLONOSCOPY WITH PROPOFOL ESOPHAGOGASTRODUODENOSCOPY (EGD) WITH PROPOFOL POLYPECTOMY BIOPSY  Patient Location: Endoscopy Unit  Anesthesia Type:MAC  Level of Consciousness: drowsy  Airway & Oxygen Therapy: Patient Spontanous Breathing and Patient connected to face mask oxygen  Post-op Assessment: Report given to RN and Post -op Vital signs reviewed and stable  Post vital signs: Reviewed and stable  Last Vitals:  Vitals Value Taken Time  BP    Temp    Pulse    Resp 15 08/18/23 1133  SpO2    Vitals shown include unfiled device data.  Last Pain:  Vitals:   08/18/23 1023  TempSrc: Temporal  PainSc: 0-No pain         Complications: No notable events documented.

## 2023-08-18 NOTE — Anesthesia Procedure Notes (Signed)
Procedure Name: MAC Date/Time: 08/18/2023 10:51 AM  Performed by: Vanessa Waldorf, CRNAPre-anesthesia Checklist: Patient identified, Emergency Drugs available, Suction available and Patient being monitored Patient Re-evaluated:Patient Re-evaluated prior to induction Oxygen Delivery Method: Simple face mask

## 2023-08-18 NOTE — Op Note (Signed)
[facility] Patient Name: Harold Barajas Procedure Date: 08/18/2023 MRN: [id number] Attending MD: Willaim Rayas. Adela Lank , MD, [id number] Date of Birth: 04/28/1951 CSN: [id number] Age: 72 Admit Type: Outpatient Procedure:                Colonoscopy Indications:              High risk colon cancer surveillance: Personal                            history of colonic polyps - multiple adenomas                            removed 07/2020. Procedure done at the hospital due                            to history of having difficult airway for intubation Providers:                Viviann Spare P. Adela Lank, MD, Rogue Jury, RN, Norman Clay, RN, Kandice Robinsons, Technician Referring MD:              Medicines:                Monitored Anesthesia Care Complications:            No immediate complications. Estimated blood loss:                            Minimal. Estimated Blood Loss:     Estimated blood loss was minimal. Procedure:                Pre-Anesthesia Assessment:                           - Prior to the procedure, a History and Physical                            was performed, and patient medications and                            allergies were reviewed. The patient's tolerance of                            previous anesthesia was also reviewed. The risks                            and benefits of the procedure and the sedation                            options and risks were discussed with the patient.                            All questions were answered, and informed consent  was obtained. Prior Anticoagulants: The patient has                            taken no anticoagulant or antiplatelet agents. ASA                            Grade Assessment: III - A patient with severe                            systemic disease. After reviewing the risks and                            benefits, the patient was deemed in  satisfactory                            condition to undergo the procedure.                           After obtaining informed consent, the colonoscope                            was passed under direct vision. Throughout the                            procedure, the patient's blood pressure, pulse, and                            oxygen saturations were monitored continuously. The                            CF-HQ190L ([id number]) Olympus colonoscope was                            introduced through the anus and advanced to the the                            cecum, identified by appendiceal orifice and                            ileocecal valve. The colonoscopy was performed                            without difficulty. The patient tolerated the                            procedure well. The quality of the bowel                            preparation was good. The ileocecal valve,                            appendiceal orifice, and rectum were photographed. Scope In: 11:07:23 AM Scope Out: 11:27:27 AM Scope Withdrawal Time: 0 hours 17 minutes 36 seconds  Total Procedure Duration: 0 hours  20 minutes 4 seconds  Findings:      The perianal and digital rectal examinations were normal.      An 8 mm polyp was found in the hepatic flexure. The polyp was sessile.       The polyp was removed with a cold snare. Resection and retrieval were       complete.      A 3 to 4 mm polyp was found in the descending colon. The polyp was       sessile. The polyp was removed with a cold snare. Resection and       retrieval were complete.      A few small-mouthed diverticula were found in the sigmoid colon.      Internal hemorrhoids were found during retroflexion. The hemorrhoids       were small.      The exam was otherwise without abnormality. Impression:               - One 8 mm polyp at the hepatic flexure, removed                            with a cold snare. Resected and retrieved.                            - One 3 to 4 mm polyp in the descending colon,                            removed with a cold snare. Resected and retrieved.                           - Diverticulosis in the sigmoid colon.                           - Internal hemorrhoids.                           - The examination was otherwise normal. Moderate Sedation:      No moderate sedation, case performed with MAC Recommendation:           - Patient has a contact number available for                            emergencies. The signs and symptoms of potential                            delayed complications were discussed with the                            patient. Return to normal activities tomorrow.                            Written discharge instructions were provided to the                            patient.                           -  Resume previous diet.                           - Continue present medications.                           - Await pathology results. Procedure Code(s):        --- Professional ---                           [phone], Colonoscopy, flexible; with removal of                            tumor(s), polyp(s), or other lesion(s) by snare                            technique Diagnosis Code(s):        --- Professional ---                           Z86.010, Personal history of colonic polyps                           D12.3, Benign neoplasm of transverse colon (hepatic                            flexure or splenic flexure)                           D12.4, Benign neoplasm of descending colon                           K64.8, Other hemorrhoids                           K57.30, Diverticulosis of large intestine without                            perforation or abscess without bleeding CPT copyright 2022 American [employer]. All rights reserved. The codes documented in this report are preliminary and upon coder review may  be revised to meet current compliance requirements. Viviann Spare P. Rayna Brenner,  MD 08/18/2023 11:37:18 AM This report has been signed electronically. Number of Addenda: 0

## 2023-08-18 NOTE — Discharge Instructions (Addendum)
YYOU HAD AN ENDOSCOPIC PROCEDURE TODAY: Refer to the procedure report and other information in the discharge instructions given to you for any specific questions about what was found during the examination. If this information does not answer your questions, please call [place] office at [phone] to clarify.   YOU SHOULD EXPECT: Some feelings of bloating in the abdomen. Passage of more gas than usual. Walking can help get rid of the air that was put into your GI tract during the procedure and reduce the bloating. If you had a lower endoscopy (such as a colonoscopy or flexible sigmoidoscopy) you may notice spotting of blood in your stool or on the toilet paper. Some abdominal soreness may be present for a day or two, also.  DIET: Your first meal following the procedure should be a light meal and then it is ok to progress to your normal diet. A half-sandwich or bowl of soup is an example of a good first meal. Heavy or fried foods are harder to digest and may make you feel nauseous or bloated. Drink plenty of fluids but you should avoid alcoholic beverages for 24 hours. If you had a esophageal dilation, please see attached instructions for diet.    ACTIVITY: Your care partner should take you home directly after the procedure. You should plan to take it easy, moving slowly for the rest of the day. You can resume normal activity the day after the procedure however YOU SHOULD NOT DRIVE, use power tools, machinery or perform tasks that involve climbing or major physical exertion for 24 hours (because of the sedation medicines used during the test).   SYMPTOMS TO REPORT IMMEDIATELY: A gastroenterologist can be reached at any hour. Please call [phone]  for any of the following symptoms:  Following lower endoscopy (colonoscopy, flexible sigmoidoscopy) Excessive amounts of blood in the stool  Significant tenderness, worsening of abdominal pains  Swelling of the abdomen that is new, acute  Fever of 100  or higher  Following upper endoscopy (EGD, EUS, ERCP, esophageal dilation) Vomiting of blood or coffee ground material  New, significant abdominal pain  New, significant chest pain or pain under the shoulder blades  Painful or persistently difficult swallowing  New shortness of breath  Black, tarry-looking or red, bloody stools  FOLLOW UP: YOU HAD AN ENDOSCOPIC PROCEDURE TODAY: Refer to the procedure report and other information in the discharge instructions given to you for any specific questions about what was found during the examination. If this information does not answer your questions, please call [place] office at [phone] to clarify.   YOU SHOULD EXPECT: Some feelings of bloating in the abdomen. Passage of more gas than usual. Walking can help get rid of the air that was put into your GI tract during the procedure and reduce the bloating. If you had a lower endoscopy (such as a colonoscopy or flexible sigmoidoscopy) you may notice spotting of blood in your stool or on the toilet paper. Some abdominal soreness may be present for a day or two, also.  DIET: Your first meal following the procedure should be a light meal and then it is ok to progress to your normal diet. A half-sandwich or bowl of soup is an example of a good first meal. Heavy or fried foods are harder to digest and may make you feel nauseous or bloated. Drink plenty of fluids but you should avoid alcoholic beverages for 24 hours. If you had a esophageal dilation, please see attached instructions for diet.  ACTIVITY: Your care partner should take you home directly after the procedure. You should plan to take it easy, moving slowly for the rest of the day. You can resume normal activity the day after the procedure however YOU SHOULD NOT DRIVE, use power tools, machinery or perform tasks that involve climbing or major physical exertion for 24 hours (because of the sedation medicines used during the test).   SYMPTOMS TO  REPORT IMMEDIATELY: A gastroenterologist can be reached at any hour. Please call [phone]  for any of the following symptoms:  Following lower endoscopy (colonoscopy, flexible sigmoidoscopy) Excessive amounts of blood in the stool  Significant tenderness, worsening of abdominal pains  Swelling of the abdomen that is new, acute  Fever of 100 or higher  Following upper endoscopy (EGD, EUS, ERCP, esophageal dilation) Vomiting of blood or coffee ground material  New, significant abdominal pain  New, significant chest pain or pain under the shoulder blades  Painful or persistently difficult swallowing  New shortness of breath  Black, tarry-looking or red, bloody stools  FOLLOW UP:  If any biopsies were taken you will be contacted by phone or by letter within the next 1-3 weeks. Call [phone]  if you have not heard about the biopsies in 3 weeks.  Please also call with any specific questions about appointments or follow up tests. If any biopsies were taken you will be contacted by phone or by letter within the next 1-3 weeks. Call [phone]  if you have not heard about the biopsies in 3 weeks.  Please also call with any specific questions about appointments or follow up tests.

## 2023-08-18 NOTE — Op Note (Signed)
[facility] Patient Name: Harold Barajas Procedure Date: 08/18/2023 MRN: [id number] Attending MD: Willaim Rayas. Adela Lank , MD, [id number] Date of Birth: 11/11/1950 CSN: [id number] Age: 72 Admit Type: Outpatient Procedure:                Upper GI endoscopy Indications:              Follow-up of gastro-esophageal reflux disease,                            follow-up of Barrett's esophagus - dx 2021, Weight                            loss - on omeprazole, patient states weight loss                            was intentional, has since stablized last 2 months Providers:                Viviann Spare P. Adela Lank, MD, Rogue Jury, RN, Norman Clay, RN, Kandice Robinsons, Technician Referring MD:              Medicines:                Monitored Anesthesia Care Complications:            No immediate complications. Estimated blood loss:                            Minimal. Estimated Blood Loss:     Estimated blood loss was minimal. Procedure:                Pre-Anesthesia Assessment:                           - Prior to the procedure, a History and Physical                            was performed, and patient medications and                            allergies were reviewed. The patient's tolerance of                            previous anesthesia was also reviewed. The risks                            and benefits of the procedure and the sedation                            options and risks were discussed with the patient.                            All questions were answered, and informed consent  was obtained. Prior Anticoagulants: The patient has                            taken no anticoagulant or antiplatelet agents. ASA                            Grade Assessment: III - A patient with severe                            systemic disease. After reviewing the risks and                            benefits, the patient was deemed in  satisfactory                            condition to undergo the procedure.                           After obtaining informed consent, the endoscope was                            passed under direct vision. Throughout the                            procedure, the patient's blood pressure, pulse, and                            oxygen saturations were monitored continuously. The                            [id number]-[id number] ([id number]) Olympus endoscope was introduced                            through the mouth, and advanced to the second part                            of duodenum. The upper GI endoscopy was                            accomplished without difficulty. The patient                            tolerated the procedure well. Scope In: Scope Out: Findings:      Esophagogastric landmarks were identified: the Z-line was found at 40       cm, the gastroesophageal junction was found at 40 cm and the upper       extent of the gastric folds was found at 41 cm from the incisors.      A 1 cm hiatal hernia was present.      The Z-line was irregular - extension of salmon colored mucosa with a       small island - overall seemed slightly less than 1cm in length, perhaps       improved from last exam. Biopsies were taken with a cold forceps for  histology given known Barrett's, rule out dysplasia.      The exam of the esophagus was otherwise normal.      A single 3 mm sessile polyp was found in the gastric body. The polyp was       removed with a cold biopsy forceps. Resection and retrieval were       complete.      The exam of the stomach was otherwise normal.      The examined duodenum was normal. Impression:               - Esophagogastric landmarks identified.                           - 1 cm hiatal hernia.                           - Z-line irregular. Biopsied. No high risk or                            concerning findings.                           - A single gastric polyp. Resected and  retrieved.                           - Normal stomach otherwise.                           - Normal examined duodenum. Moderate Sedation:      No moderate sedation, case performed with MAC Recommendation:           - Patient has a contact number available for                            emergencies. The signs and symptoms of potential                            delayed complications were discussed with the                            patient. Return to normal activities tomorrow.                            Written discharge instructions were provided to the                            patient.                           - Resume previous diet.                           - Continue present medications.                           - Await pathology results. Procedure Code(s):        --- Professional ---  91478, Esophagogastroduodenoscopy, flexible,                            transoral; with biopsy, single or multiple Diagnosis Code(s):        --- Professional ---                           K22.70, Barrett's esophagus without dysplasia                           K44.9, Diaphragmatic hernia without obstruction or                            gangrene                           K22.89, Other specified disease of esophagus                           K31.7, Polyp of stomach and duodenum                           K21.9, Gastro-esophageal reflux disease without                            esophagitis                           R63.4, Abnormal weight loss CPT copyright 2022 American Medical Association. All rights reserved. The codes documented in this report are preliminary and upon coder review may  be revised to meet current compliance requirements. Viviann Spare P. Nioka Thorington, MD 08/18/2023 11:31:50 AM This report has been signed electronically. Number of Addenda: 0

## 2023-08-18 NOTE — Anesthesia Preprocedure Evaluation (Addendum)
Anesthesia Evaluation  Patient identified by MRN, date of birth, ID band Patient awake    Reviewed: Allergy & Precautions, NPO status , Patient's Chart, lab work & pertinent test results  History of Anesthesia Complications (+) DIFFICULT AIRWAY and history of anesthetic complications  Airway Mallampati: II  TM Distance: >3 FB Neck ROM: Full    Dental  (+) Dental Advisory Given, Teeth Intact   Pulmonary sleep apnea and Continuous Positive Airway Pressure Ventilation , former smoker   Pulmonary exam normal        Cardiovascular hypertension, Pt. on medications and Pt. on home beta blockers + CAD  Normal cardiovascular exam     Neuro/Psych negative neurological ROS  negative psych ROS   GI/Hepatic Neg liver ROS, PUD,GERD  Medicated and Controlled,,  Endo/Other   Borderline DM   Renal/GU negative Renal ROS     Musculoskeletal  (+) Arthritis ,    Abdominal   Peds  Hematology negative hematology ROS (+)   Anesthesia Other Findings   Reproductive/Obstetrics                             Anesthesia Physical Anesthesia Plan  ASA: 3  Anesthesia Plan: MAC   Post-op Pain Management: Minimal or no pain anticipated   Induction:   PONV Risk Score and Plan: 1 and Propofol infusion and Treatment may vary due to age or medical condition  Airway Management Planned: Nasal Cannula and Natural Airway  Additional Equipment: None  Intra-op Plan:   Post-operative Plan:   Informed Consent: I have reviewed the patients History and Physical, chart, labs and discussed the procedure including the risks, benefits and alternatives for the proposed anesthesia with the patient or authorized representative who has indicated his/her understanding and acceptance.       Plan Discussed with: CRNA and Anesthesiologist  Anesthesia Plan Comments:        Anesthesia Quick Evaluation

## 2023-08-18 NOTE — H&P (Signed)
[facility] History and Physical   Primary Care Physician:  Garald Braver   Reason for Procedure:   GERD, history of Barrett's, weight loss, history of colon polyps  Plan:    EGD and colonoscopy     HPI: Harold Barajas. is a 72 y.o. male  here for EGD and colonoscopy to evaluate issues as outlined. 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 On omeprazole, GERD has been doing okay. He did lose weight although sounds like that was intentional and weight stable in recent months. Last colonoscopy - 07/2020 - numerous adenomas removed, here for surveillance exam. He was having diarrhea at the time of last office visit, however olmesartan stopped and his symptoms have significantly improved. Bowels now regular.  No family history of colon cancer known. Otherwise feels well without any cardiopulmonary symptoms.   He has a history of reported difficult intubation in the past, case done at the hospital for that reason. I have discussed risks / benefits of the procedures and anesthesia and he wants to proceed.  I have discussed risks / benefits of anesthesia and endoscopic procedure with Einar Grad. and they wish to proceed with the exams as outlined today.    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    Past Surgical History:  Procedure Laterality Date   BIOPSY  07/03/2020   Procedure: BIOPSY;  Surgeon: Benancio Deeds, MD;  Location: [facility];   Service: Gastroenterology;;   COLONOSCOPY  2006   [place]   COLONOSCOPY WITH PROPOFOL N/A 07/03/2020   Procedure: COLONOSCOPY WITH PROPOFOL;  Surgeon: Benancio Deeds, MD;  Location: [facility];  Service: Gastroenterology;  Laterality: N/A;   ESOPHAGOGASTRODUODENOSCOPY (EGD) WITH PROPOFOL N/A 07/03/2020   Procedure: ESOPHAGOGASTRODUODENOSCOPY (EGD) WITH PROPOFOL;  Surgeon: Benancio Deeds, MD;  Location: [facility];  Service: Gastroenterology;  Laterality: N/A;   ESOPHAGOGASTRODUODENOSCOPY (EGD) WITH PROPOFOL N/A 11/13/2020   Procedure: ESOPHAGOGASTRODUODENOSCOPY (EGD) WITH PROPOFOL;  Surgeon: Benancio Deeds, MD;  Location: [facility];  Service: Gastroenterology;  Laterality: N/A;   LUMBAR LAMINECTOMY/DECOMPRESSION MICRODISCECTOMY N/A 01/20/2017   Procedure: Lumbar two to Sacral one Laminectomy for Decompression;  Surgeon: Ditty, Loura Halt, MD;  Location: [facility] OR;  Service: Neurosurgery;  Laterality: N/A;   POLYPECTOMY  07/03/2020   Procedure: POLYPECTOMY;  Surgeon: Benancio Deeds, MD;  Location: [facility];  Service: Gastroenterology;;    Prior to Admission medications   Medication Sig Start Date End Date Taking? Authorizing Provider  cetirizine (ZYRTEC) 10 MG tablet Take 10 mg by mouth daily. 12/13/15  Yes [provider]  Cholecalciferol (VITAMIN D3) 1.25 MG (50000 UT) CAPS Take 50,000 Units by mouth every Sunday. 10/12/20  Yes [provider]  cyanocobalamin 2000  MCG tablet Take 2,000 mcg by mouth daily.   Yes [provider]  losartan (COZAAR) 100 MG tablet Take 100 mg by mouth daily.   Yes [provider]  metoprolol tartrate (LOPRESSOR) 50 MG tablet Take 1 tablet by mouth 2 (two) times daily. 03/18/23  Yes [provider]  Multiple Vitamin (MULTIVITAMIN WITH MINERALS) TABS tablet Take 1 tablet by mouth daily.   Yes [provider]  Na Sulfate-K Sulfate-Mg Sulf 17.5-3.13-1.6 GM/177ML SOLN Use as directed; may  use generic; goodrx card if insurance will not cover generic 06/05/23  Yes Doree Albee, PA-C  Omega-3 Fatty Acids (FISH OIL PO) Take 2,000 mg by mouth daily.   Yes [provider]  omeprazole (PRILOSEC) 40 MG capsule Take 1 capsule (40 mg total) by mouth daily. Please schedule a yearly follow up for further refills. Thank you. 11/10/21  Yes Keondria Siever, Willaim Rayas, MD  simvastatin (ZOCOR) 20 MG tablet Take 20 mg by mouth at bedtime. 06/23/20  Yes [provider]  tamsulosin (FLOMAX) 0.4 MG CAPS capsule TAKE 1 CAPSULE BY MOUTH EVERYDAY AT BEDTIME 01/21/23  Yes [provider]  aspirin EC 81 MG tablet Take 81 mg by mouth every evening. Swallow whole.    [provider]  carvedilol (COREG) 25 MG tablet Take 25 mg by mouth 2 (two) times daily. Patient not taking: Reported on 06/05/2023 01/17/20   [provider]  Cholecalciferol (DIALYVITE VITAMIN D 5000) 125 MCG (5000 UT) capsule Take 5,000 Units by mouth See admin instructions. Take 5000 units 6 days a week, skip Sunday dose Patient not taking: Reported on 06/05/2023    [provider]  olmesartan (BENICAR) 40 MG tablet Take 40 mg by mouth every evening.  02/15/20   [provider]    Current Facility-Administered Medications  Medication Dose Route Frequency Provider Last Rate Last Admin   0.9 %  sodium chloride infusion   Intravenous Continuous Doree Albee, PA-C   New Bag at 08/18/23 1018   Facility-Administered Medications Ordered in Other Encounters  Medication Dose Route Frequency Provider Last Rate Last Admin   gadopentetate dimeglumine (MAGNEVIST) injection 20 mL  20 mL Intravenous Once PRN York Spaniel, MD        Allergies as of 06/05/2023 - Review Complete 06/05/2023  Allergen Reaction Noted   Penicillins Other (See Comments) 01/02/2016    Family History  Problem Relation Age of Onset   Stroke Mother    Heart attack Mother    Aneurysm Father 23   Crohn's  disease Father    Other Father        Crohn's dx (dx age 63)   Diabetes Brother    Heart attack Brother    Leukemia Maternal Grandmother    Colon cancer Neg Hx    Rectal cancer Neg Hx    Esophageal cancer Neg Hx    Colon polyps Neg Hx    Stomach cancer Neg Hx     Social History   Socioeconomic History   Marital status: Married    Spouse name: Natalia Leatherwood   Number of children: 2   Years of education: 12   Highest education level: Not on file  Occupational History   Occupation: Retired  Tobacco Use   Smoking status: Former    Current packs/day: 0.00    Average packs/day: 1 pack/day for 20.0 years (20.0 ttl pk-yrs)    Types: Cigarettes    Start date: 01/08/1960    Quit date: 01/08/1980  Years since quitting: 43.6   Smokeless tobacco: Never   Tobacco comments:    Quit 1982  Vaping Use   Vaping status: Never Used  Substance and Sexual Activity   Alcohol use: Yes    Alcohol/week: 8.0 standard drinks of alcohol    Types: 8 Glasses of wine per week   Drug use: No   Sexual activity: Not on file  Other Topics Concern   Not on file  Social History Narrative   Lives at home w/ his wife   Right-handed   32 oz caffeine per day   Social Drivers of Corporate investment banker Strain: Not on file  Food Insecurity: Not on file  Transportation Needs: Not on file  Physical Activity: Not on file  Stress: Not on file  Social Connections: Not on file  Intimate Partner Violence: Not on file    Review of Systems: All other review of systems negative except as mentioned in the HPI.  Physical Exam: Vital signs BP (!) 166/69   Pulse 68   Temp 98.4 F (36.9 C) (Temporal)   Resp 14   Ht 6' (1.829 m)   Wt 100.7 kg   SpO2 98%   BMI 30.11 kg/m   General:   Alert,  Well-developed, pleasant and cooperative in NAD Lungs:  Clear throughout to auscultation.   Heart:  Regular rate and rhythm Abdomen:  Soft, nontender and nondistended.   Neuro/Psych:  Alert and cooperative. Normal  mood and affect. A and O x 3  Harlin Rain, MD [facility]

## 2023-08-19 LAB — SURGICAL PATHOLOGY

## 2023-08-20 ENCOUNTER — Encounter (HOSPITAL_COMMUNITY): Payer: Self-pay | Admitting: Gastroenterology

## 2024-06-13 ENCOUNTER — Other Ambulatory Visit: Payer: Self-pay | Admitting: Urology

## 2024-06-13 DIAGNOSIS — R972 Elevated prostate specific antigen [PSA]: Secondary | ICD-10-CM

## 2024-08-17 ENCOUNTER — Inpatient Hospital Stay: Admission: RE | Admit: 2024-08-17 | Discharge: 2024-08-17 | Attending: Urology

## 2024-08-17 DIAGNOSIS — R972 Elevated prostate specific antigen [PSA]: Secondary | ICD-10-CM

## 2024-08-17 MED ORDER — GADOPICLENOL 0.5 MMOL/ML IV SOLN
10.0000 mL | Freq: Once | INTRAVENOUS | Status: AC | PRN
  Administered 2024-08-17: 09:00:00 10 mL via INTRAVENOUS
